# Patient Record
Sex: Male | Born: 1960 | Race: Black or African American | Hispanic: No | Marital: Married | Smoking: Former smoker
Health system: Southern US, Community
[De-identification: ages and names within clinical notes are randomized; demographics above are authoritative.]

## PROBLEM LIST (undated history)

## (undated) DIAGNOSIS — E785 Hyperlipidemia, unspecified: Secondary | ICD-10-CM

## (undated) DIAGNOSIS — E119 Type 2 diabetes mellitus without complications: Secondary | ICD-10-CM

## (undated) DIAGNOSIS — M1990 Unspecified osteoarthritis, unspecified site: Secondary | ICD-10-CM

## (undated) DIAGNOSIS — Z87442 Personal history of urinary calculi: Secondary | ICD-10-CM

## (undated) HISTORY — DX: Unspecified osteoarthritis, unspecified site: M19.90

## (undated) HISTORY — DX: Hyperlipidemia, unspecified: E78.5

## (undated) HISTORY — DX: Personal history of urinary calculi: Z87.442

## (undated) HISTORY — DX: Type 2 diabetes mellitus without complications: E11.9

---

## 2020-12-28 ENCOUNTER — Ambulatory Visit: Payer: Medicaid Other | Admitting: Orthopedic Surgery

## 2020-12-31 ENCOUNTER — Ambulatory Visit: Payer: Medicaid Other | Admitting: Orthopedic Surgery

## 2021-01-07 ENCOUNTER — Ambulatory Visit: Payer: Medicaid Other | Admitting: Orthopedic Surgery

## 2021-01-07 ENCOUNTER — Telehealth: Payer: Self-pay

## 2021-01-07 NOTE — Telephone Encounter (Signed)
Attempted to contact patient to get him rescheduled to see Dr. Ralene Ok next week.

## 2021-01-10 ENCOUNTER — Ambulatory Visit (INDEPENDENT_AMBULATORY_CARE_PROVIDER_SITE_OTHER): Payer: Medicaid Other | Admitting: Orthopedic Surgery

## 2021-01-10 ENCOUNTER — Encounter: Payer: Self-pay | Admitting: Orthopedic Surgery

## 2021-01-10 ENCOUNTER — Ambulatory Visit: Payer: Self-pay

## 2021-01-10 DIAGNOSIS — M5442 Lumbago with sciatica, left side: Secondary | ICD-10-CM

## 2021-01-10 DIAGNOSIS — M25572 Pain in left ankle and joints of left foot: Secondary | ICD-10-CM

## 2021-01-10 DIAGNOSIS — G8929 Other chronic pain: Secondary | ICD-10-CM | POA: Diagnosis not present

## 2021-01-10 DIAGNOSIS — M25571 Pain in right ankle and joints of right foot: Secondary | ICD-10-CM | POA: Diagnosis not present

## 2021-01-10 MED ORDER — CELECOXIB 200 MG PO CAPS: 200.0000 mg | ORAL_CAPSULE | Freq: Two times a day (BID) | ORAL | 3 refills | Status: DC

## 2021-01-10 NOTE — Progress Notes (Signed)
Office Visit Note   Patient: Micheal Garcia           Date of Birth: 1960-08-07           MRN: [id number] Visit Date: 01/10/2021              Requested by: Madaline Brilliant, NP [facility] at [facility] [address] [place],  [place] [address] PCP: Madaline Brilliant, NP  Chief Complaint  Patient presents with   Lower Back - Pain   Right Ankle - Pain   Left Ankle - Pain      HPI: Patient is a 60 year old gentleman who is seen for chronic pain of multiple joints in his body.  Patient states he has pain with start up in the morning.  He states he has had knee pain left wrist pain right elbow pain bilateral midfoot pain as well as back pain with left-sided radicular symptoms.  Patient states he is a type II diabetic.  He feels like all his joints are locking up.  Assessment & Plan: Visit Diagnoses:  1. Chronic pain of both ankles   2. Chronic left-sided low back pain with left-sided sciatica     Plan: Will prescribe Celebrex to help with his multiple arthritic symptoms.  With his diabetes did not recommend prednisone.  Recommended stiff soled shoes for both feet to unload the talonavicular joint.  Recommended Voltaren gel for the midfoot arthritis 3 times a day.  Follow-Up Instructions: Return in about 4 weeks (around 02/07/2021).   Ortho Exam  Patient is alert, oriented, no adenopathy, well-dressed, normal affect, normal respiratory effort. Examination patient has a negative straight leg raise bilaterally no focal motor weakness in either lower extremity.  Examination of both feet he has good subtalar and ankle range of motion.  He has pain to palpation across the talonavicular joint bilaterally radiographs shows spurring at the talonavicular joint bilaterally.  Examination of the left leg he has a negative sciatic tension sign he has pain radiating to the left knee.  Patient is a type II diabetic but there are no hemoglobin A1c's in the system.  Imaging: No results  found. No images are attached to the encounter.  Labs: No results found for: HGBA1C, ESRSEDRATE, CRP, LABURIC, REPTSTATUS, GRAMSTAIN, CULT, LABORGA   No results found for: ALBUMIN, PREALBUMIN, CBC  No results found for: MG No results found for: VD25OH  No results found for: PREALBUMIN No flowsheet data found.   There is no height or weight on file to calculate BMI.  Orders:  Orders Placed This Encounter  Procedures   XR Ankle 2 Views Right   XR Ankle 2 Views Left   XR Lumbar Spine 2-3 Views   Meds ordered this encounter  Medications   celecoxib (CELEBREX) 200 MG capsule    Sig: Take 1 capsule (200 mg total) by mouth 2 (two) times daily.    Dispense:  60 capsule    Refill:  3     Procedures: No procedures performed  Clinical Data: No additional findings.  ROS:  All other systems negative, except as noted in the HPI. Review of Systems  Objective: Vital Signs: There were no vitals taken for this visit.  Specialty Comments:  No specialty comments available.  PMFS History: There are no problems to display for this patient.  History reviewed. No pertinent past medical history.  History reviewed. No pertinent family history.  History reviewed. No pertinent surgical history. Social History   Occupational History  Not on file  Tobacco Use   Smoking status: Not on file   Smokeless tobacco: Not on file  Substance and Sexual Activity   Alcohol use: Not on file   Drug use: Not on file   Sexual activity: Not on file

## 2021-01-11 ENCOUNTER — Encounter: Payer: Self-pay | Admitting: Orthopedic Surgery

## 2021-01-11 ENCOUNTER — Ambulatory Visit: Payer: Self-pay

## 2021-01-11 ENCOUNTER — Other Ambulatory Visit: Payer: Self-pay

## 2021-01-11 ENCOUNTER — Ambulatory Visit (INDEPENDENT_AMBULATORY_CARE_PROVIDER_SITE_OTHER): Payer: Medicaid Other | Admitting: Orthopedic Surgery

## 2021-01-11 VITALS — BP 123/82 | HR 88 | Ht 70.5 in | Wt 232.0 lb

## 2021-01-11 DIAGNOSIS — M19021 Primary osteoarthritis, right elbow: Secondary | ICD-10-CM

## 2021-01-11 DIAGNOSIS — M19032 Primary osteoarthritis, left wrist: Secondary | ICD-10-CM

## 2021-01-11 DIAGNOSIS — M79641 Pain in right hand: Secondary | ICD-10-CM

## 2021-01-11 DIAGNOSIS — M25521 Pain in right elbow: Secondary | ICD-10-CM

## 2021-01-11 DIAGNOSIS — M25532 Pain in left wrist: Secondary | ICD-10-CM | POA: Diagnosis not present

## 2021-01-11 MED ORDER — BETAMETHASONE SOD PHOS & ACET 6 (3-3) MG/ML IJ SUSP
6.0000 mg | INTRAMUSCULAR | Status: AC | PRN
Start: 2021-01-11 — End: 2021-01-11
  Administered 2021-01-11: 6 mg via INTRA_ARTICULAR

## 2021-01-11 MED ORDER — LIDOCAINE HCL 1 % IJ SOLN
1.0000 mL | INTRAMUSCULAR | Status: AC | PRN
  Administered 2021-01-11: 1 mL

## 2021-01-11 NOTE — Progress Notes (Signed)
Office Visit Note   Patient: Micheal Garcia           Date of Birth: Jan 28, 1961           MRN: [id number] Visit Date: 01/11/2021              Requested by: Madaline Brilliant, NP [facility] at [facility] [address] [place],  [place] [address] PCP: Madaline Brilliant, NP   Assessment & Plan: Visit Diagnoses:  1. Pain of right hand   2. Pain in right elbow   3. Pain in left wrist     Plan: Discussed with patient that his symptoms are likely secondary to left radiocarpal and right elbow osteoarthritis.  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  He was recent seen by Dr. Lajoyce Garcia who prescribed Celebrex for multiple joint complaints and recommended topical voltaren gel.  We talked about other non surgical options for arthritis treatment including bracing and corticosteroid injections.  He wants to try a corticosteroid injection into his left wrist.  We discussed the importance of close blood sugar monitoring given his diabetes.  He checks his blood sugar multiple times per day.  He is going to start the celebrex, try the voltaren gel, and return to the office in several months if he's still having issues.   Follow-Up Instructions: No follow-ups on file.   Orders:  Orders Placed This Encounter  Procedures   XR Wrist 2 Views Left   XR Elbow 2 Views Right   No orders of the defined types were placed in this encounter.     Procedures: Hand/UE Inj for (Wrist arthritis) on 01/11/2021 4:08 PM Indications: pain Details: 25 G needle, dorsal approach Medications: 1 mL lidocaine 1 %; 6 mg betamethasone acetate-betamethasone sodium phosphate 6 (3-3) MG/ML Outcome: tolerated well, no immediate complications Procedure, treatment alternatives, risks and benefits explained, specific risks discussed. Patient was prepped and draped in the usual sterile fashion.      Clinical  Data: No additional findings.   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 Pain 9.5-10/10,     This is a 60 yo RHD M who presents with pain in the right elbow and left wrist.  These are chronic issues and have been going on for years.  His wrist pain is poorly localized but seem to mostly at the dorsal and radial aspect of the wrist.  His pain is worse w/ ROM and can be 9.5/10 at worst.  His elbow pain is similarly worse w/ ROM and activity.  He has tried tylenol and ibuprofen with some symptom relief.  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  He was recently seen by Dr. Lajoyce Garcia for bilateral midfoot, low back, and right knee pain.  He was given a prescription for celebrex but hasn't started taking it yet.    Review  of Systems  Constitutional: Negative.   Respiratory: Negative.    Cardiovascular: Negative.   Skin: Negative.     Objective: Vital Signs: BP 123/82 (BP Location: Left Arm, Patient Position: Sitting)   Pulse 88   Ht 5' 10.5" (1.791 m)   Wt 232 lb (105.2 kg)   BMI 32.82 kg/m   Physical Exam Cardiovascular:     Rate and Rhythm: Normal rate.     Pulses: Normal pulses.  Pulmonary:     Effort: Pulmonary effort is normal.  Skin:    General: Skin is warm and dry.     Capillary Refill: Capillary refill takes less than 2 seconds.  Neurological:     Mental Status: He is alert.    Left Hand Exam   Tenderness  The patient is experiencing tenderness in the dorsal area.   Muscle Strength  The  patient has normal left wrist strength.  Other  Erythema: absent Sensation: normal Pulse: present  Comments:  Swelling with TTP at dorsal radial aspect of wrist at radioscaphoid/SL interval area.  Pain worse w/ terminal extension.  No ulnar sided pain.    Right Elbow Exam   Tenderness  The patient is experiencing tenderness in the radial capitellar joint.   Muscle Strength  The patient has normal right elbow strength.  Other  Erythema: absent Sensation: normal Pulse: present  Comments:  Elbow ROM from 10-100 degrees.  Near full prosupination.      Specialty Comments:  No specialty comments available.  Imaging: 2V of the R elbow taken today are reviewed and interprted by me.  They demonstrate radiocapitellar and ulnohumeral arthritis with joint space narrowing and osteophytes.  3V of the L wrist taken today also reviewed and interpreted by me.  They demonstrate radioscaphoid arthritis with joint space narrowing and osteophytes.  He also has evidence of STT arthritis.    PMFS History: There are no problems to display for this patient.  No past medical history on file.  No family history on file.  No past surgical history on file. Social History   Occupational History   Not on file  Tobacco Use   Smoking status: Never   Smokeless tobacco: Never  Substance and Sexual Activity   Alcohol use: Not on file   Drug use: Not on file   Sexual activity: Not on file

## 2021-01-25 ENCOUNTER — Ambulatory Visit: Payer: Medicaid Other | Admitting: Orthopedic Surgery

## 2021-02-04 ENCOUNTER — Ambulatory Visit: Payer: Medicaid Other | Admitting: Orthopedic Surgery

## 2021-02-07 ENCOUNTER — Ambulatory Visit: Payer: Medicaid Other | Admitting: Orthopedic Surgery

## 2021-02-15 ENCOUNTER — Ambulatory Visit: Payer: Medicaid Other | Admitting: Orthopedic Surgery

## 2021-02-17 ENCOUNTER — Other Ambulatory Visit: Payer: Self-pay

## 2021-02-17 ENCOUNTER — Encounter: Payer: Self-pay | Admitting: Orthopedic Surgery

## 2021-02-17 ENCOUNTER — Ambulatory Visit (INDEPENDENT_AMBULATORY_CARE_PROVIDER_SITE_OTHER): Payer: Medicaid Other | Admitting: Orthopedic Surgery

## 2021-02-17 ENCOUNTER — Telehealth: Payer: Self-pay | Admitting: Orthopedic Surgery

## 2021-02-17 DIAGNOSIS — M5442 Lumbago with sciatica, left side: Secondary | ICD-10-CM | POA: Diagnosis not present

## 2021-02-17 DIAGNOSIS — G8929 Other chronic pain: Secondary | ICD-10-CM | POA: Diagnosis not present

## 2021-02-17 NOTE — Progress Notes (Signed)
   Office Visit Note   Patient: Micheal Garcia           Date of Birth: [birth year]           MRN: [id number] Visit Date: 02/17/2021              Requested by: Madaline Brilliant, NP [facility] at [facility] [address] [place],  [place] [address] PCP: Madaline Brilliant, NP  Chief Complaint  Patient presents with   Lower Back - Pain, Follow-up      HPI: Patient is a 60 year old gentleman who presents with chronic lower back pain with radicular symptoms.  Patient states that the Celebrex has not worked.  Exercise has not provided any relief.  Assessment & Plan: Visit Diagnoses:  1. Chronic left-sided low back pain with left-sided sciatica     Plan: Chronic lower back pain with chronic history of sciatic symptoms worse now on the left lower extremity.  Plan: We will order an MRI scan of the lumbar spine and plan to follow-up with Dr. Alvester Morin for evaluation for epidural steroid injection.  Follow-Up Instructions: Return in about 2 weeks (around 03/03/2021) for Plan to follow-up with Dr. Alvester Morin after the MRI scan.   Ortho Exam  Patient is alert, oriented, no adenopathy, well-dressed, normal affect, normal respiratory effort. Patient has no change in his physical exam he has a negative sciatic tension sign no focal motor weakness.  Imaging: No results found. No images are attached to the encounter.  Labs: No results found for: HGBA1C, ESRSEDRATE, CRP, LABURIC, REPTSTATUS, GRAMSTAIN, CULT, LABORGA   No results found for: ALBUMIN, PREALBUMIN, CBC  No results found for: MG No results found for: VD25OH  No results found for: PREALBUMIN No flowsheet data found.   There is no height or weight on file to calculate BMI.  Orders:  Orders Placed This Encounter  Procedures   MR Lumbar Spine w/o contrast   No orders of the defined types were placed in this encounter.    Procedures: No procedures performed  Clinical Data: No additional findings.  ROS:  All other  systems negative, except as noted in the HPI. Review of Systems  Objective: Vital Signs: There were no vitals taken for this visit.  Specialty Comments:  No specialty comments available.  PMFS History: Patient Active Problem List   Diagnosis Date Noted   Arthritis of wrist, left 01/11/2021   Degenerative arthritis of right elbow 01/11/2021   History reviewed. No pertinent past medical history.  History reviewed. No pertinent family history.  History reviewed. No pertinent surgical history. Social History   Occupational History   Not on file  Tobacco Use   Smoking status: Never   Smokeless tobacco: Never  Substance and Sexual Activity   Alcohol use: Not on file   Drug use: Not on file   Sexual activity: Not on file

## 2021-02-17 NOTE — Telephone Encounter (Signed)
Left another message for pt to call back and get resch'd per Autumn as Dr. Lajoyce Corners will be out of office this afternoon

## 2021-02-24 ENCOUNTER — Telehealth: Payer: Self-pay

## 2021-02-24 NOTE — Telephone Encounter (Signed)
I called and lm on vm to advise the pt that his MRI has been denied by his insurance company and that he must complete 6 weeks of formal physical therapy first before they will consider auth for scan. Advised pt to call and let me know where he would like to go otherwise I could send referral for [facility] neuro rehab.

## 2021-02-25 ENCOUNTER — Other Ambulatory Visit: Payer: Self-pay

## 2021-02-25 ENCOUNTER — Ambulatory Visit (INDEPENDENT_AMBULATORY_CARE_PROVIDER_SITE_OTHER): Payer: Medicaid Other | Admitting: Orthopedic Surgery

## 2021-02-25 DIAGNOSIS — M19021 Primary osteoarthritis, right elbow: Secondary | ICD-10-CM | POA: Diagnosis not present

## 2021-02-25 DIAGNOSIS — M19032 Primary osteoarthritis, left wrist: Secondary | ICD-10-CM | POA: Diagnosis not present

## 2021-02-25 MED ORDER — DICLOFENAC SODIUM 75 MG PO TBEC: 75.0000 mg | DELAYED_RELEASE_TABLET | Freq: Two times a day (BID) | ORAL | 0 refills | Status: AC

## 2021-02-25 NOTE — Progress Notes (Signed)
Office Visit Note   Patient: Micheal Garcia           Date of Birth: 11-09-1960           MRN: [id number] Visit Date: 02/25/2021              Requested by: Madaline Brilliant, NP [facility] at [facility] [address] [place],  [place] [address] PCP: Madaline Brilliant, NP   Assessment & Plan: Visit Diagnoses:  1. Arthritis of wrist, left   2. Primary osteoarthritis of right elbow     Plan: We again discussed the nature of osteoarthritis and treatment options from conservative to surgical.  He got a month or so relief from a corticosteroid injection into the left wrist.  He has been on Celebrex with modest symptom relief.  His biggest concern is being able to work.  He is not interested in surgery at this point given the lengthy time out from work that would be required.  We will try a stronger anti-inflammatory and send him to hand therapy to have a removable wrist brace made which may provide some support while he's working.  He can see me back as needed.   Follow-Up Instructions: No follow-ups on file.   Orders:  No orders of the defined types were placed in this encounter.  No orders of the defined types were placed in this encounter.     Procedures: No procedures performed   Clinical Data: No additional findings.   Subjective: Chief Complaint  Patient presents with   Right Elbow - Follow-up   Left Wrist - Follow-up    This is a 60 yo RHD M who presents with continued pain in the right elbow and left wrist.  He was last seen about a month ago at which time we discussed the nature of osteoarthritis.  He underwent CSI into the left wrist with a month or so relief.  His biggest issues occur while he is trying to work.  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  He also has chronic pain and limited ROM in the right shoulder.  He has tried celebrex previously but has recently been taken off of this medication.  He is having concurrent back issues  and will be undergoing MRI and ESI in the near future.         Review of Systems   Objective: Vital Signs: There were no vitals taken for this visit.  Physical Exam Constitutional:      Appearance: Normal appearance.  Cardiovascular:     Rate and Rhythm: Normal rate.     Pulses: Normal pulses.  Pulmonary:     Effort: Pulmonary effort is normal.  Skin:    General: Skin is warm and dry.     Capillary Refill: Capillary refill takes less than 2 seconds.  Neurological:     Mental Status: He is alert.    Left Hand Exam   Tenderness  The patient is experiencing tenderness in the dorsal area.   Muscle Strength  The patient has normal left wrist strength.  Other  Erythema: absent Sensation: normal Pulse: present  Comments:  Mild swelling of dorsal wrist w/ TTP at radioscaphoid/SL interval area.    Right Elbow Exam   Tenderness  The patient is experiencing tenderness in the lateral epicondyle and radial head.   Other  Erythema: absent Sensation: normal Pulse: present  Comments:  ROM from approx 10-90 degrees w/ most  pain at terminal extension.     Specialty Comments:  No specialty comments available.  Imaging: No results found.   PMFS History: Patient Active Problem List   Diagnosis Date Noted   Arthritis of wrist, left 01/11/2021   Degenerative arthritis of right elbow 01/11/2021   No past medical history on file.  No family history on file.  No past surgical history on file. Social History   Occupational History   Not on file  Tobacco Use   Smoking status: Never   Smokeless tobacco: Never  Substance and Sexual Activity   Alcohol use: Not on file   Drug use: Not on file   Sexual activity: Not on file

## 2021-02-28 ENCOUNTER — Telehealth: Payer: Self-pay | Admitting: Orthopedic Surgery

## 2021-02-28 NOTE — Telephone Encounter (Signed)
Order has been changed to [facility]

## 2021-02-28 NOTE — Telephone Encounter (Signed)
Patient requesting referral to another PT office that accepts his Medicaid card  [phone]

## 2021-03-13 ENCOUNTER — Other Ambulatory Visit: Payer: Medicaid Other

## 2021-03-15 ENCOUNTER — Ambulatory Visit: Payer: Medicaid Other | Admitting: Occupational Therapy

## 2021-03-24 ENCOUNTER — Ambulatory Visit: Payer: Medicaid Other | Admitting: Occupational Therapy

## 2021-04-14 ENCOUNTER — Telehealth: Payer: Self-pay | Admitting: Orthopedic Surgery

## 2021-04-14 NOTE — Telephone Encounter (Signed)
Pt called wondering if he could get a refill on diclofenac 75 mg?   CB [phone]

## 2021-04-14 NOTE — Telephone Encounter (Signed)
Please see request below. Last filled 02/25/2021

## 2021-04-15 ENCOUNTER — Telehealth: Payer: Self-pay | Admitting: Orthopedic Surgery

## 2021-04-15 ENCOUNTER — Other Ambulatory Visit (HOSPITAL_COMMUNITY): Payer: Self-pay | Admitting: Orthopedic Surgery

## 2021-04-15 NOTE — Telephone Encounter (Signed)
Pt called about an update for medication refill. Please call pt when medication is filled. Pt phone number is [phone].

## 2021-04-18 ENCOUNTER — Telehealth: Payer: Self-pay

## 2021-04-18 ENCOUNTER — Telehealth: Payer: Self-pay | Admitting: Orthopedic Surgery

## 2021-04-18 NOTE — Telephone Encounter (Signed)
Pt called back stating the name of the rx he would like refilled is diclofenac 75 mg; he would like a CB when this has been sent in to his [place].   [phone]

## 2021-04-18 NOTE — Telephone Encounter (Signed)
Pt called in stating he would like Dr. Frazier Butt to send in another refill on his medication since he is out of his 30 day supply. Patient didn't state what medication he needed a refill on before he hung up.

## 2021-04-18 NOTE — Telephone Encounter (Signed)
Err

## 2021-04-19 ENCOUNTER — Other Ambulatory Visit: Payer: Self-pay | Admitting: Orthopedic Surgery

## 2021-04-19 MED ORDER — DICLOFENAC SODIUM 75 MG PO TBEC: 75.0000 mg | DELAYED_RELEASE_TABLET | Freq: Two times a day (BID) | ORAL | 0 refills | Status: AC

## 2021-05-13 NOTE — Telephone Encounter (Deleted)
Error

## 2021-05-19 ENCOUNTER — Other Ambulatory Visit: Payer: Self-pay | Admitting: Radiology

## 2021-05-19 MED ORDER — MELOXICAM 7.5 MG PO TABS: 7.5000 mg | ORAL_TABLET | Freq: Every day | ORAL | 0 refills | Status: DC

## 2021-06-02 ENCOUNTER — Emergency Department (HOSPITAL_COMMUNITY): Payer: Medicaid Other

## 2021-06-02 ENCOUNTER — Telehealth (HOSPITAL_COMMUNITY): Payer: Self-pay | Admitting: Emergency Medicine

## 2021-06-02 ENCOUNTER — Emergency Department (HOSPITAL_COMMUNITY)
Admission: EM | Admit: 2021-06-02 | Discharge: 2021-06-02 | Disposition: A | Discharge status: Home / Self Care | Payer: Medicaid Other | Attending: Emergency Medicine | Admitting: Emergency Medicine

## 2021-06-02 ENCOUNTER — Encounter (HOSPITAL_COMMUNITY): Payer: Self-pay | Admitting: Emergency Medicine

## 2021-06-02 ENCOUNTER — Other Ambulatory Visit: Payer: Self-pay

## 2021-06-02 DIAGNOSIS — R109 Unspecified abdominal pain: Secondary | ICD-10-CM | POA: Diagnosis present

## 2021-06-02 DIAGNOSIS — N200 Calculus of kidney: Secondary | ICD-10-CM | POA: Diagnosis not present

## 2021-06-02 DIAGNOSIS — I10 Essential (primary) hypertension: Secondary | ICD-10-CM | POA: Insufficient documentation

## 2021-06-02 LAB — COMPREHENSIVE METABOLIC PANEL
ALT: 22 U/L (ref 0–44)
AST: 20 U/L (ref 15–41)
Albumin: 4.3 g/dL (ref 3.5–5.0)
Alkaline Phosphatase: 53 U/L (ref 38–126)
Anion gap: 10 (ref 5–15)
BUN: 18 mg/dL (ref 8–23)
CO2: 22 mmol/L (ref 22–32)
Calcium: 9.3 mg/dL (ref 8.9–10.3)
Chloride: 105 mmol/L (ref 98–111)
Creatinine, Ser: 1.17 mg/dL (ref 0.61–1.24)
GFR, Estimated: >60 mL/min (ref >60)
Glucose, Bld: 190 mg/dL — ABNORMAL HIGH (ref 70–99)
Potassium: 3.4 mmol/L — ABNORMAL LOW (ref 3.5–5.1)
Sodium: 137 mmol/L (ref 135–145)
Total Bilirubin: 0.8 mg/dL (ref 0.3–1.2)
Total Protein: 7.3 g/dL (ref 6.5–8.1)

## 2021-06-02 LAB — URINALYSIS, ROUTINE W REFLEX MICROSCOPIC
Bilirubin Urine: NEGATIVE
Glucose, UA: 150 mg/dL — AB
Hgb urine dipstick: NEGATIVE
Ketones, ur: 5 mg/dL — AB
Leukocytes,Ua: NEGATIVE
Nitrite: NEGATIVE
Protein, ur: NEGATIVE mg/dL
Specific Gravity, Urine: 1.026 (ref 1.005–1.030)
pH: 7 (ref 5.0–8.0)

## 2021-06-02 LAB — LIPASE, BLOOD: Lipase: 29 U/L (ref 11–51)

## 2021-06-02 LAB — CBC
HCT: 42.1 % (ref 39.0–52.0)
Hemoglobin: 13.8 g/dL (ref 13.0–17.0)
MCH: 27.1 pg (ref 26.0–34.0)
MCHC: 32.8 g/dL (ref 30.0–36.0)
MCV: 82.5 fL (ref 80.0–100.0)
Platelets: 190 10*3/uL (ref 150–400)
RBC: 5.1 MIL/uL (ref 4.22–5.81)
RDW: 14 % (ref 11.5–15.5)
WBC: 10.2 10*3/uL (ref 4.0–10.5)
nRBC: 0 % (ref 0.0–0.2)

## 2021-06-02 MED ORDER — SODIUM CHLORIDE 0.9 % IV BOLUS
500.0000 mL | Freq: Once | INTRAVENOUS | Status: AC
  Administered 2021-06-02: 500 mL via INTRAVENOUS

## 2021-06-02 MED ORDER — TAMSULOSIN HCL 0.4 MG PO CAPS: 0.4000 mg | ORAL_CAPSULE | Freq: Every day | ORAL | 0 refills | Status: DC

## 2021-06-02 MED ORDER — HYDROCODONE-ACETAMINOPHEN 5-325 MG PO TABS: 1.0000 | ORAL_TABLET | Freq: Four times a day (QID) | ORAL | 0 refills | Status: DC | PRN

## 2021-06-02 MED ORDER — ONDANSETRON HCL 4 MG PO TABS: 4.0000 mg | ORAL_TABLET | Freq: Three times a day (TID) | ORAL | 0 refills | Status: DC | PRN

## 2021-06-02 MED ORDER — KETOROLAC TROMETHAMINE 30 MG/ML IJ SOLN
30.0000 mg | Freq: Once | INTRAMUSCULAR | Status: AC
  Administered 2021-06-02: 30 mg via INTRAVENOUS
  Filled 2021-06-02: qty 1

## 2021-06-02 MED ORDER — MORPHINE SULFATE (PF) 4 MG/ML IV SOLN
4.0000 mg | Freq: Once | INTRAVENOUS | Status: AC
  Administered 2021-06-02: 4 mg via INTRAVENOUS
  Filled 2021-06-02: qty 1

## 2021-06-02 NOTE — Telephone Encounter (Signed)
Pharmacy called and there was a problem with the Norco prescription that I sent.  They requested the prescription get recent which I have completed.

## 2021-06-02 NOTE — Discharge Instructions (Addendum)
You have been seen and discharged from the emergency department.  You have been diagnosed with a right-sided kidney stone.  Stay well-hydrated.  Take ibuprofen as needed for pain control.  Take stronger pain medicine as needed.  Do not mix this medication with alcohol or other sedating medications. Do not drive or do heavy physical activity until you know how this medication affects you.  It may cause drowsiness.  Take Flomax to aid in stone passage.  Take nausea medicine as needed.  Follow-up with your primary provider for further evaluation and further care. Take home medications as prescribed. If you have any worsening symptoms, worsening pain, inability to tolerate oral food/water/medications, high fevers or further concerns for your health please return to an emergency department for further evaluation.

## 2021-06-02 NOTE — ED Provider Notes (Signed)
[facility] DEPT Provider Note   CSN: [id number] Arrival date & time: 06/02/21  [id number]     History  Chief Complaint  Patient presents with   Abdominal Pain    Micheal Garcia is a 61 y.o. male.  HPI  61 year old male with past medical history of HTN, HLD presents to the emergency department with right-sided abdominal pain that radiates to the right flank.  Patient states this woke him up from sleep at around 3 AM.  He describes the pain as right abdominal radiating to the right flank and back.  He has never had any pain like this before.  Denies any history kidney stones.  No genitourinary symptoms.  Denies any fever, nausea/vomiting/diarrhea.  No history of AAA.  States since the pain started and has been constant, sharp and worsening in severity.  Home Medications Prior to Admission medications   Medication Sig Start Date End Date Taking? Authorizing Provider  atorvastatin (LIPITOR) 40 MG tablet Take 40 mg by mouth daily. 04/14/21   [provider]  losartan (COZAAR) 50 MG tablet Take 50 mg by mouth daily. 03/21/21   [provider]  meloxicam (MOBIC) 7.5 MG tablet Take 1 tablet (7.5 mg total) by mouth daily. 05/19/21   Sherilyn Cooter, MD  metFORMIN (GLUCOPHAGE) 1000 MG tablet SMARTSIG:1 Tablet(s) By Mouth Morning-Evening 03/25/21   [provider]      Allergies    Patient has no allergy information on record.    Review of Systems   Review of Systems  Constitutional:  Negative for fever.  Respiratory:  Negative for shortness of breath.   Cardiovascular:  Negative for chest pain.  Gastrointestinal:  Positive for abdominal pain. Negative for diarrhea and vomiting.  Genitourinary:  Positive for flank pain. Negative for difficulty urinating, dysuria and hematuria.  Musculoskeletal:  Positive for back pain.  Skin:  Negative for rash.  Neurological:  Negative for headaches.   Physical Exam Updated Vital Signs BP (!)  146/88    Pulse 81    Temp 98.3 F (36.8 C) (Oral)    Resp 19    SpO2 98%  Physical Exam Vitals and nursing note reviewed.  Constitutional:      General: He is not in acute distress.    Appearance: Normal appearance.  HENT:     Head: Normocephalic.     Mouth/Throat:     Mouth: Mucous membranes are moist.  Cardiovascular:     Rate and Rhythm: Normal rate.  Pulmonary:     Effort: Pulmonary effort is normal. No respiratory distress.  Abdominal:     Palpations: Abdomen is soft.     Tenderness: There is abdominal tenderness in the right lower quadrant. There is no guarding or rebound.  Skin:    General: Skin is warm.  Neurological:     Mental Status: He is alert and oriented to person, place, and time. Mental status is at baseline.  Psychiatric:        Mood and Affect: Mood normal.    ED Results / Procedures / Treatments   Labs (all labs ordered are listed, but only abnormal results are displayed) Labs Reviewed  COMPREHENSIVE METABOLIC PANEL - Abnormal; Notable for the following components:      Result Value   Potassium 3.4 (*)    Glucose, Bld 190 (*)    All other components within normal limits  LIPASE, BLOOD  CBC  URINALYSIS, ROUTINE W REFLEX MICROSCOPIC    EKG None  Radiology No  results found.  Procedures Procedures    Medications Ordered in ED Medications  morphine (PF) 4 MG/ML injection 4 mg (4 mg Intravenous Given 06/02/21 0916)  sodium chloride 0.9 % bolus 500 mL (500 mLs Intravenous New Bag/Given 06/02/21 [id number])    ED Course/ Medical Decision Making/ A&P                           Medical Decision Making Amount and/or Complexity of Data Reviewed Labs: ordered. Radiology: ordered. ECG/medicine tests: ordered.  Risk Prescription drug management.   This patient presents to the ED for concern of right-sided abdominal/flank/back pain, this involves an extensive number of treatment options, and is a complaint that carries with it a high risk of  complications and morbidity.  The differential diagnosis includes kidney stone, intra-abdominal pathology, AAA   Additional history obtained: -Additional history obtained from spouse at bedside -External records from outside source obtained and reviewed including: Chart review including previous notes, labs, imaging, consultation notes   Lab Tests: -I ordered, reviewed, and interpreted labs.  The pertinent results include: Baseline and reassuring blood work   Imaging Studies ordered: -I ordered imaging studies including ultrasound of the aorta and CT renal study -I independently visualized and interpreted imaging which showed no aneurysm and right-sided 2 mm kidney stone at the UVJ -I agree with the radiologist interpretation   Medicines ordered and prescription drug management: -I ordered medication including pain medicine for kidney stone -Reevaluation of the patient after these medicines showed that the patient improved -I have reviewed the patients home medicines and have made adjustments as needed   ED Course: 61 year old male presents emergency department right-sided abdominal pain radiating to his flank/back.  Vitals are stable, abdomen is benign.  Ultrasound shows no AAA, blood work is baseline and reassuring.  CT renal study shows a 2 mm kidney stone on the right with some mild perinephric stranding.  No signs of obstruction, AKI or UTI.  Plan for treatment and outpatient follow-up.   Cardiac Monitoring: The patient was maintained on a cardiac monitor.  I personally viewed and interpreted the cardiac monitored which showed an underlying rhythm of: Sinus   Reevaluation: After the interventions noted above, I reevaluated the patient and found that they have :improved   Dispostion: Patient at this time appears safe and stable for discharge and close outpatient follow up. Discharge plan and strict return to ED precautions discussed, patient verbalizes understanding and  agreement.        Final Clinical Impression(s) / ED Diagnoses Final diagnoses:  None    Rx / DC Orders ED Discharge Orders     None         Lorelle Gibbs, DO 06/02/21 1153

## 2021-06-02 NOTE — ED Triage Notes (Signed)
Pt reports abdominal pain that radiates from middle to the right side around to his back. Pain since 3am.

## 2021-06-02 NOTE — Telephone Encounter (Signed)
Needed resend the script

## 2021-06-02 NOTE — ED Notes (Signed)
Pt states understanding of dc instructions, importance of follow up. Pt denies questions or concerns and declined transportation assistance upon dc. Pt ambulated w/ a steady gait w/o need for assistance. No belongings left in room upon dc. ? ?

## 2021-08-15 ENCOUNTER — Ambulatory Visit (INDEPENDENT_AMBULATORY_CARE_PROVIDER_SITE_OTHER): Payer: Medicaid Other | Admitting: Orthopedic Surgery

## 2021-08-15 DIAGNOSIS — M19032 Primary osteoarthritis, left wrist: Secondary | ICD-10-CM

## 2021-08-15 MED ORDER — LIDOCAINE HCL 1 % IJ SOLN
1.0000 mL | INTRAMUSCULAR | Status: AC | PRN
  Administered 2021-08-15: 1 mL

## 2021-08-15 MED ORDER — BETAMETHASONE SOD PHOS & ACET 6 (3-3) MG/ML IJ SUSP
6.0000 mg | INTRAMUSCULAR | Status: AC | PRN
  Administered 2021-08-15: 6 mg via INTRA_ARTICULAR

## 2021-08-15 NOTE — Progress Notes (Signed)
? ?Office Visit Note ?  ?Patient: Micheal Garcia           ?Date of Birth: [birth year]           ?MRN: [id number] ?Visit Date: 08/15/2021 ?             ?Requested by: No referring provider defined for this encounter. ?PCP: Madaline Brilliant, NP (Inactive) ? ? ?Assessment & Plan: ?Visit Diagnoses:  ?1. Arthritis of wrist, left   ? ? ?Plan: We again reviewed the nature of wrist arthritis including its diagnosis, prognosis, and both conservative and surgical treatment options.  He had 1 corticosteroid junction with give him significant relief for at least a month.  He is wearing a removable wrist brace.  We discussed surgical treatment options for wrist arthritis.  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  After our discussion today, he would like to proceed with a repeat corticosteroid junction in the left wrist. ? ?Follow-Up Instructions: No follow-ups on file.  ? ?Orders:  ?No orders of the defined types were placed in this encounter. ? ?No orders of the defined types were placed in this encounter. ? ? ? ? Procedures: ?Medium Joint Inj: L radiocarpal on 08/15/2021 9:59 AM ?Indications: joint swelling and pain ?Details: 25 G 1.5 in needle, dorsal approach ?Medications: 1 mL lidocaine 1 %; 6 mg betamethasone acetate-betamethasone sodium phosphate 6 (3-3) MG/ML ?Procedure, treatment alternatives, risks and benefits explained, specific risks discussed. Consent was given by the patient. Immediately prior to procedure a time out was called to verify the correct patient, procedure, equipment, support staff and site/side marked as required. Patient was prepped and draped in the usual sterile fashion.  ? ? ? ? ?Clinical Data: ?No additional findings. ? ? ?Subjective: ?Chief Complaint  ?Patient presents with  ? Left Wrist - Pain  ? Right Elbow - Pain  ? ? 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  He would like to talk about his wrist pain today as it seems to be most bothersome for him.  He continues to describe pain at the dorsal radial aspect of the wrist that is worse with prolonged activity and range of motion.  He has tried an prescription oral anti-inflammatory but gets better some relief from over-the-counter arthritis medication.  He had a corticosteroid injection previously which provided significant symptom relief for a month or so.  He has been wearing a wrist brace.  He also has significant problems with his back which he has been for previously. ? ? ? ?Review of Systems ? ? ?Objective: ?Vital Signs: There were no vitals taken for this visit. ? ?Physical Exam ?Constitutional:   ?   Appearance: Normal appearance.  ?Cardiovascular:  ?   Rate and Rhythm: Normal rate.  ?   Pulses: Normal pulses.  ?Pulmonary:  ?   Effort: Pulmonary effort is normal.  ?Skin: ?   General: Skin is warm and dry.  ?   Capillary Refill: Capillary refill takes less than 2 seconds.  ?Neurological:  ?   Mental Status: He is alert.  ? ? ?Left Hand Exam  ? ?Tenderness  ?Left hand tenderness location: TTP at dorsal and radial aspect of wrist w/ mild to moderate swelling.  ? ?Other  ?Erythema: absent ?Sensation: normal ?Pulse:  present ? ?Comments:  Wrist ROM 55/20 and significantly diminished from contralateral side.  Pain w/ terminal wrist extension/flexion/radial deviation.  Full and painless ROM of fingers and able to make a complete fist.  ? ? ? ? ?Specialty Comments:  ?No specialty comments available. ? ?Imaging: ?No results found. ? ? ?PMFS History: ?Patient Active Problem List  ? Diagnosis Date Noted  ? Arthritis of wrist, left 01/11/2021  ? Degenerative arthritis of right elbow 01/11/2021  ? ?No past medical history on file.  ?No family history on file.  ?No past surgical history on file. ?Social History  ? ?Occupational History  ?  Not on file  ?Tobacco Use  ? Smoking status: Never  ? Smokeless tobacco: Never  ?Substance and Sexual Activity  ? Alcohol use: Not on file  ? Drug use: Not on file  ? Sexual activity: Not on file  ? ? ? ? ? ? ?

## 2021-09-15 ENCOUNTER — Ambulatory Visit: Payer: Medicaid Other | Admitting: Orthopedic Surgery

## 2021-09-26 ENCOUNTER — Ambulatory Visit (INDEPENDENT_AMBULATORY_CARE_PROVIDER_SITE_OTHER): Payer: Medicaid Other

## 2021-09-26 ENCOUNTER — Ambulatory Visit (INDEPENDENT_AMBULATORY_CARE_PROVIDER_SITE_OTHER): Payer: Medicaid Other | Admitting: Orthopedic Surgery

## 2021-09-26 ENCOUNTER — Encounter: Payer: Self-pay | Admitting: Orthopedic Surgery

## 2021-09-26 DIAGNOSIS — G8929 Other chronic pain: Secondary | ICD-10-CM | POA: Diagnosis not present

## 2021-09-26 DIAGNOSIS — M5441 Lumbago with sciatica, right side: Secondary | ICD-10-CM

## 2021-09-26 MED ORDER — PREDNISONE 10 MG PO TABS: 10.0000 mg | ORAL_TABLET | Freq: Every day | ORAL | 0 refills | Status: DC

## 2021-09-26 NOTE — Progress Notes (Signed)
   Office Visit Note   Patient: Micheal Garcia           Date of Birth: 03/26/1961           MRN: [id number] Visit Date: 09/26/2021              Requested by: No referring provider defined for this encounter. 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  His lower back pain is gotten worse over the past several weeks and has been symptomatic for over a year.  Assessment & Plan: Visit Diagnoses:  1. Chronic right-sided low back pain with right-sided sciatica     Plan: We will try low-dose prednisone 10 mg with breakfast discussed this will increase his blood glucose.  Follow-Up Instructions: Return in about 4 weeks (around 10/24/2021).   Ortho Exam  Patient is alert, oriented, no adenopathy, well-dressed, normal affect, normal respiratory effort. Examination patient has a positive straight leg raise on the right there is no focal motor weakness with good strength in all motor groups.  Imaging: XR Lumbar Spine 2-3 Views  Result Date: 09/26/2021 2 view radiographs of the lumbar spine shows degenerative disc disease worse at L3-L4 and L5.  No pars defect.  No images are attached to the encounter.  Labs: No results found for: "HGBA1C", "ESRSEDRATE", "CRP", "LABURIC", "REPTSTATUS", "GRAMSTAIN", "CULT", "LABORGA"   Lab Results  Component Value Date   ALBUMIN 4.3 06/02/2021    No results found for: "MG" No results found for: "VD25OH"  No results found for: "PREALBUMIN"    Latest Ref Rng & Units 06/02/2021    7:51 AM  CBC EXTENDED  WBC 4.0 - 10.5 K/uL 10.2   RBC 4.22 - 5.81 MIL/uL 5.10   Hemoglobin 13.0 - 17.0 g/dL 33.8   HCT 25.0 - 53.9 % 42.1   Platelets 150 - 400 K/uL 190      There is no height or weight on file to calculate  BMI.  Orders:  Orders Placed This Encounter  Procedures   XR Lumbar Spine 2-3 Views   Meds ordered this encounter  Medications   predniSONE (DELTASONE) 10 MG tablet    Sig: Take 1 tablet (10 mg total) by mouth daily with breakfast.    Dispense:  30 tablet    Refill:  0     Procedures: No procedures performed  Clinical Data: No additional findings.  ROS:  All other systems negative, except as noted in the HPI. Review of Systems  Objective: Vital Signs: There were no vitals taken for this visit.  Specialty Comments:  No specialty comments available.  PMFS History: Patient Active Problem List   Diagnosis Date Noted   Arthritis of wrist, left 01/11/2021   Degenerative arthritis of right elbow 01/11/2021   History reviewed. No pertinent past medical history.  History reviewed. No pertinent family history.  History reviewed. No pertinent surgical history. Social History   Occupational History   Not on file  Tobacco Use   Smoking status: Never   Smokeless tobacco: Never  Substance and Sexual Activity   Alcohol use: Not on file   Drug use: Not on file   Sexual activity: Not on file

## 2021-09-27 ENCOUNTER — Encounter: Payer: Self-pay | Admitting: Physician Assistant

## 2021-09-28 ENCOUNTER — Telehealth: Payer: Self-pay | Admitting: Family

## 2021-09-28 NOTE — Telephone Encounter (Signed)
Pt called and stated he can not take the prednisone that was prescribed for him. He states it made his sugar rise and was unable to see last night. Please call pt at [phone].

## 2021-09-28 NOTE — Telephone Encounter (Signed)
Lmtcb, advised on VM to discontinue taking the prednisone at this time. Asked him to call back with how he is feeling.

## 2021-10-27 ENCOUNTER — Ambulatory Visit: Payer: Medicaid Other | Admitting: Orthopedic Surgery

## 2021-10-31 ENCOUNTER — Ambulatory Visit: Payer: Medicaid Other | Admitting: Orthopedic Surgery

## 2021-11-04 ENCOUNTER — Ambulatory Visit (INDEPENDENT_AMBULATORY_CARE_PROVIDER_SITE_OTHER): Payer: Medicaid Other | Admitting: Orthopedic Surgery

## 2021-11-04 ENCOUNTER — Ambulatory Visit: Payer: Self-pay

## 2021-11-04 DIAGNOSIS — M19032 Primary osteoarthritis, left wrist: Secondary | ICD-10-CM | POA: Diagnosis not present

## 2021-11-04 MED ORDER — LIDOCAINE HCL 1 % IJ SOLN
1.0000 mL | INTRAMUSCULAR | Status: AC | PRN
  Administered 2021-11-04: 1 mL

## 2021-11-04 MED ORDER — BETAMETHASONE SOD PHOS & ACET 6 (3-3) MG/ML IJ SUSP
6.0000 mg | INTRAMUSCULAR | Status: AC | PRN
  Administered 2021-11-04: 6 mg via INTRA_ARTICULAR

## 2021-11-04 NOTE — Progress Notes (Signed)
Office Visit Note   Patient: Micheal Garcia           Date of Birth: 1961/03/16           MRN: [id number] Visit Date: 11/04/2021              Requested by: No referring provider defined for this encounter. PCP: Madaline Brilliant, NP (Inactive)   Assessment & Plan: Visit Diagnoses:  1. Arthritis of wrist, left     Plan: We reviewed patient's x-rays from today which show arthritis at the radial scaphoid articulation consistent with SLAC wrist.  We again discussed the nature of wrist arthritis.  At this point, we will try another corticosteroid injection and I will refer him to hand therapy to have a better fitting Thermoplast brace made and work on grip strength and adaptive strategies to help with his wrist pain.  Follow-Up Instructions: No follow-ups on file.   Orders:  Orders Placed This Encounter  Procedures   XR Wrist Complete Left   No orders of the defined types were placed in this encounter.     Procedures: Medium Joint Inj: L radiocarpal on 11/04/2021 4:58 PM Indications: pain and joint swelling Details: 25 G 1.5 in needle, dorsal approach Medications: 1 mL lidocaine 1 %; 6 mg betamethasone acetate-betamethasone sodium phosphate 6 (3-3) MG/ML Procedure, treatment alternatives, risks and benefits explained, specific risks discussed. Consent was given by the patient. Immediately prior to procedure a time out was called to verify the correct patient, procedure, equipment, support staff and site/side marked as required. Patient was prepped and draped in the usual sterile fashion.       Clinical Data: No additional findings.   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  He was last seen in my office in the beginning of May at which point he underwent  corticosteroid injection into the wrist.  This provides some symptom relief.  He wears a brace but does not fit very well for him.  He is having difficulty with his job secondary to this wrist pain.  He also has significant problems with his back and is being followed for chronic back pain.    Review of Systems   Objective: Vital Signs: There were no vitals taken for this visit.  Physical Exam  Left Hand Exam   Tenderness  Left hand tenderness location: TTP at radiocarpal articulation with mild swelling.   Other  Erythema: absent Sensation: normal Pulse: present  Comments:  Wrist ROM largely unchanged at 50/20.  Full ROM of fingers but can't make a full fist today.       Specialty Comments:  No specialty comments available.  Imaging: No results found.   PMFS History: Patient Active Problem List   Diagnosis Date Noted   Arthritis of wrist, left 01/11/2021   Degenerative arthritis of right elbow 01/11/2021   No past medical history on file.  No family history on file.  No past surgical history on file. Social History   Occupational History   Not on file  Tobacco Use   Smoking status: Never   Smokeless tobacco: Never  Substance and Sexual Activity   Alcohol use: Not on file   Drug use: Not on file   Sexual activity: Not on file

## 2021-11-10 ENCOUNTER — Ambulatory Visit (INDEPENDENT_AMBULATORY_CARE_PROVIDER_SITE_OTHER): Payer: Medicaid Other | Admitting: Orthopedic Surgery

## 2021-11-10 ENCOUNTER — Encounter: Payer: Self-pay | Admitting: Orthopedic Surgery

## 2021-11-10 DIAGNOSIS — M5441 Lumbago with sciatica, right side: Secondary | ICD-10-CM

## 2021-11-10 DIAGNOSIS — G8929 Other chronic pain: Secondary | ICD-10-CM

## 2021-11-10 NOTE — Progress Notes (Signed)
   Office Visit Note   Patient: Micheal Garcia           Date of Birth: 1960-06-25           MRN: [id number] Visit Date: 11/10/2021              Requested by: No referring provider defined for this encounter. PCP: Madaline Brilliant, NP (Inactive)  Chief Complaint  Patient presents with   Lower Back - Pain, Follow-up      HPI: Patient is a 61 year old gentleman who presents in follow-up for his lumbar spine.  Patient has done therapy on his own without relief.  He has tried prednisone without relief.  Patient most recently had a injection for the left wrist and this did provide some interval relief.  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  Patient states the right-sided radicular pain is the worse with driving.  Assessment & Plan: Visit Diagnoses:  1. Chronic right-sided low back pain with right-sided sciatica     Plan: We will place an order for MRI scan of the lumbar spine.  Plan to follow-up after this is obtained anticipate follow-up with Dr. Alvester Morin.  Follow-Up Instructions: Return in about 2 weeks (around 11/24/2021).   Ortho Exam  Patient is alert, oriented, no adenopathy, well-dressed, normal affect, normal respiratory effort. Examination patient has a positive sciatic tension sign bilaterally motor strength is symmetric in both lower extremities with no focal motor weakness.  Imaging: No results found. No images are attached to the encounter.  Labs: No results found for: "HGBA1C", "ESRSEDRATE", "CRP", "LABURIC", "REPTSTATUS", "GRAMSTAIN", "CULT", "LABORGA"   Lab Results  Component Value Date   ALBUMIN 4.3 06/02/2021    No results found for: "MG" No results found for: "VD25OH"  No results found for: "PREALBUMIN"    Latest Ref Rng & Units 06/02/2021    7:51 AM  CBC EXTENDED  WBC 4.0 - 10.5 K/uL 10.2   RBC 4.22 - 5.81 MIL/uL 5.10   Hemoglobin 13.0 - 17.0 g/dL 40.8   HCT 14.4 - 81.8 % 42.1   Platelets 150  - 400 K/uL 190      There is no height or weight on file to calculate BMI.  Orders:  No orders of the defined types were placed in this encounter.  No orders of the defined types were placed in this encounter.    Procedures: No procedures performed  Clinical Data: No additional findings.  ROS:  All other systems negative, except as noted in the HPI. Review of Systems  Objective: Vital Signs: There were no vitals taken for this visit.  Specialty Comments:  No specialty comments available.  PMFS History: Patient Active Problem List   Diagnosis Date Noted   Arthritis of wrist, left 01/11/2021   Degenerative arthritis of right elbow 01/11/2021   History reviewed. No pertinent past medical history.  History reviewed. No pertinent family history.  History reviewed. No pertinent surgical history. Social History   Occupational History   Not on file  Tobacco Use   Smoking status: Never   Smokeless tobacco: Never  Substance and Sexual Activity   Alcohol use: Not on file   Drug use: Not on file   Sexual activity: Not on file

## 2021-11-18 ENCOUNTER — Encounter: Payer: Self-pay | Admitting: Occupational Therapy

## 2021-11-18 ENCOUNTER — Ambulatory Visit: Payer: Medicaid Other | Attending: Orthopedic Surgery | Admitting: Occupational Therapy

## 2021-11-18 ENCOUNTER — Telehealth: Payer: Self-pay | Admitting: Orthopedic Surgery

## 2021-11-18 DIAGNOSIS — M25532 Pain in left wrist: Secondary | ICD-10-CM | POA: Insufficient documentation

## 2021-11-18 DIAGNOSIS — R600 Localized edema: Secondary | ICD-10-CM | POA: Diagnosis present

## 2021-11-18 DIAGNOSIS — M25632 Stiffness of left wrist, not elsewhere classified: Secondary | ICD-10-CM | POA: Diagnosis present

## 2021-11-18 DIAGNOSIS — M6281 Muscle weakness (generalized): Secondary | ICD-10-CM | POA: Diagnosis present

## 2021-11-18 NOTE — Therapy (Signed)
OUTPATIENT OCCUPATIONAL THERAPY ORTHO EVALUATION  Patient Name: Micheal Garcia MRN: [id number] DOB:1960-07-05, 61 y.o., male Today's Date: 11/18/2021  PCP: Verlon Au, MD   REFERRING PROVIDER: Marlyne Beards, MD   OT End of Session - 11/18/21 [id number]     Visit Number 1    Date for OT Re-Evaluation 01/17/22    Authorization Type [place] [employer]    Authorization Time Period VL: 27 combined OT/PT/ST    OT Start Time 0840    OT Stop Time 0915    OT Time Calculation (min) 35 min    Activity Tolerance Patient limited by pain    Behavior During Therapy [facility] for tasks assessed/performed            History reviewed. No pertinent past medical history. History reviewed. No pertinent surgical history.  Patient Active Problem List   Diagnosis Date Noted   Arthritis of wrist, left 01/11/2021   Degenerative arthritis of right elbow 01/11/2021    ONSET DATE: 11/04/2021 (date of OT order)  REFERRING DIAG: M19.032 (ICD-10-CM) - Arthritis of wrist, left  THERAPY DIAG:  Pain in left wrist  Stiffness of left wrist, not elsewhere classified  Muscle weakness (generalized)  Localized edema  Rationale for Evaluation and Treatment Rehabilitation  SUBJECTIVE:   SUBJECTIVE STATEMENT: Pt arrives to OP OT evaluation w/ primary concern related to pain in his L wrist. States he started noticing some soreness in 2020, but saw the doctor more recently when the pain became constant and he could not management "no matter what he took." Pt reports he has has pain in his R elbow, stating it is "bone on bone," which has caused him to use his LUE more and likely led to more pain more quickly. OT recommended reaching out to his MD for bracing options due to referral for OT only addressing L wrist pain. Is looking into getting PT for LBP w/ sciatica. Pt accompanied by: self  PERTINENT HISTORY: Arthritis at the radial scaphoid articulation of L wrist, consistent with SLAC wrist; Per  progress note w/ Dr. Frazier Butt: "At this point, we will try another corticosteroid injection and I will refer him to hand therapy to have a better fitting Thermoplast brace made and work on grip strength and adaptive strategies to help with his wrist pain."  PRECAUTIONS: None  WEIGHT BEARING RESTRICTIONS No  PAIN: Are you having pain? Yes: NPRS scale: 8/10 Pain location: lower back Pain description: "pain" Aggravating factors: sitting/driving Relieving factors: switching between sitting and standing  FALLS: Has patient fallen in last 6 months? 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[employer]); non-emergency medical transport vehicle driver  PATIENT GOALS: Decrease pain and improve functional use of UEs  OBJECTIVE:   HAND DOMINANCE: Right  ADLs: Overall ADLs: Pt reports he is able to complete all BADLs w/ at least Mod Ind, but is relying on his L, non-dominant side due to the pain in his R elbow and is not having more pain on his L side because of it  FUNCTIONAL OUTCOME MEASURES: Quick Dash: 68.2/100  (severe functional impairment)  UPPER EXTREMITY ROM     Active ROM Right Eval - 8/11 Left Eval - 8/11  Wrist flexion 32 20  Wrist extension 57 30  Wrist ulnar deviation 32 21  Wrist radial deviation 20 12  Wrist pronation 67 56  Wrist supination 49 55  (Blank rows = not tested)  UPPER EXTREMITY MMT: Not assessed at time of evaluation due to pain  HAND FUNCTION: Grip strength: Right: 94 lbs; Left: 23 lbs  COORDINATION: WFL; increased pain on radial side of wrist w/ thumb to index finger opposition  SENSATION: Reports occasional tingling; improved w/ compression  EDEMA:  Measured circumferentially around wrist: Right: 19.4 cm, Left: 20.2 cm  COGNITION: Overall  cognitive status: Within functional limits for tasks assessed  OBSERVATIONS: Increased pain w/ decreased arc of motion on L wrist compared to R, though signs of discomfort (grimacing, pulling away) w/ wrist movement on R side due to elbow pain   TODAY'S TREATMENT:  None   PATIENT EDUCATION: Educated on role and purpose of OT as well as potential interventions and goals for therapy based on initial evaluation findings. Person educated: Patient Education method: Explanation Education comprehension: verbalized understanding   HOME EXERCISE PROGRAM: To be administered  GOALS: Goals reviewed with patient? No  SHORT TERM GOALS: Target date:  12/16/21       Status:  1 Pt will demonstrate independence w/ wear and care of custom-fabricated orthosis Baseline: to be administered next session Initial  2 Pt will demonstrate decreased edema circumferentially around L wrist to 19.9 cm or less for improved pain and ROM Baseline: Right: 19.4 cm, Left: 20.2 cm Initial  3 Pt will increase functional use of L, dominant UE during all ADLs as evidenced by improving QuickDASH score to 60% or better, indicating moderate functional impairment Baseline: 68.2/100 Initial    LONG TERM GOALS: Target date:  01/17/22       Status:  1 Pt will improve AROM of L wrist extension to at least 40 degrees to restore functional motion for tasks like reach and grasp  Baseline: Right: 57 deg, Left: 30 deg Initial  2 Pt will demonstrate independence w/ HEP designed for increased ROM and strength of L wrist and hand Baseline: to be administered Initial  3 Pt will improve grip strength in L hand by 25 lbs with pain less than 3/10 for increased functional use during IADLs and work-related tasks Baseline: Right: 94 lbs; Left: 23 lbs Initial  4 Pt will increase functional use of L, dominant UE during all ADLs as evidenced by improving QuickDASH score to 40% or better, indicating minimal functional impairment Baseline:  68.2/100 Initial    ASSESSMENT:  CLINICAL IMPRESSION: Pt is a 61 y/o who presents to OP OT due to persistent L wrist pain 2/2 arthritis consistent w/ SLAC wrist. Pt was referred by Dr. Tempie Donning for fabrication of custom fabricated wrist immobilization orthosis, indicated to prevent movement, and protect and facilitate healing for several weeks; to be administered next session. Pt is being treated conservatively and has been in a prefabricated orthosis. Evaluation indicated significant pain, decreased L hand grip strength, and limited ROM w/ testing MMT limited by protocol and pain. Pt will benefit from skilled occupational therapy services to address strength, ROM, pain management, introduction of compensatory strategies/AE prn, and implementation of an HEP to improve participation and safety during ADLs and ensure maximal functional use of dominant LUE.  PERFORMANCE DEFICITS in functional skills including edema, ROM, strength, pain, body mechanics, decreased knowledge of precautions, and UE functional use.  IMPAIRMENTS are limiting patient from ADLs, IADLs, rest and sleep, and work.   COMORBIDITIES has co-morbidities such as R elbow pain and LBP  that affects occupational performance. Patient will benefit from skilled OT to address above impairments  and improve overall function.  MODIFICATION OR ASSISTANCE TO COMPLETE EVALUATION: No modification of tasks or assist necessary to complete an evaluation.  OT OCCUPATIONAL PROFILE AND HISTORY: Problem focused assessment: Including review of records relating to presenting problem.  CLINICAL DECISION MAKING: Moderate - several treatment options, min-mod task modification necessary  REHAB POTENTIAL: Good  EVALUATION COMPLEXITY: Moderate   PLAN: OT FREQUENCY: 1x/week  OT DURATION: 8 weeks  PLANNED INTERVENTIONS: self care/ADL training, therapeutic exercise, therapeutic activity, manual therapy, passive range of motion, splinting, electrical  stimulation, ultrasound, iontophoresis, paraffin, fluidotherapy, compression bandaging, moist heat, cryotherapy, patient/family education, and DME and/or AE instructions  RECOMMENDED OTHER SERVICES: None  CONSULTED AND AGREED WITH PLAN OF CARE: Patient  PLAN FOR NEXT SESSION: Custom wrist immobilization orthosis   Rosie Fate, MSOT, OTR/L 11/18/2021, 9:12 AM

## 2021-11-18 NOTE — Telephone Encounter (Signed)
Patient called advised he went to out patient PT this morning and the therapist asked if he could get a Rx for a brace for his elbow. The number to contact patient is [phone]

## 2021-11-22 ENCOUNTER — Ambulatory Visit: Payer: Medicaid Other | Admitting: Occupational Therapy

## 2021-11-24 ENCOUNTER — Telehealth: Payer: Self-pay | Admitting: Orthopedic Surgery

## 2021-11-24 ENCOUNTER — Ambulatory Visit: Payer: Medicaid Other | Admitting: Occupational Therapy

## 2021-11-24 ENCOUNTER — Encounter: Payer: Self-pay | Admitting: Occupational Therapy

## 2021-11-24 DIAGNOSIS — M25532 Pain in left wrist: Secondary | ICD-10-CM

## 2021-11-24 DIAGNOSIS — M6281 Muscle weakness (generalized): Secondary | ICD-10-CM

## 2021-11-24 DIAGNOSIS — M25632 Stiffness of left wrist, not elsewhere classified: Secondary | ICD-10-CM

## 2021-11-24 DIAGNOSIS — R600 Localized edema: Secondary | ICD-10-CM

## 2021-11-24 NOTE — Therapy (Signed)
OUTPATIENT OCCUPATIONAL THERAPY TREATMENT NOTE  Patient Name: Micheal Garcia MRN: [id number] DOB:November 18, 1960, 61 y.o., male Today's Date: 11/24/2021  PCP: Verlon Au, MD   REFERRING PROVIDER: Marlyne Beards, MD   OT End of Session - 11/24/21 1604     Visit Number 2    Date for OT Re-Evaluation 01/17/22    Authorization Type [place] [employer]    Authorization Time Period VL: 27 combined OT/PT/ST    OT Start Time 1600    OT Stop Time 1700    OT Time Calculation (min) 60 min    Activity Tolerance Patient limited by pain    Behavior During Therapy [facility] for tasks assessed/performed            History reviewed. No pertinent past medical history. History reviewed. No pertinent surgical history.  Patient Active Problem List   Diagnosis Date Noted   Arthritis of wrist, left 01/11/2021   Degenerative arthritis of right elbow 01/11/2021    ONSET DATE: 11/04/2021 (date of OT order)  REFERRING DIAG: M19.032 (ICD-10-CM) - Arthritis of wrist, left  THERAPY DIAG:  Pain in left wrist  Stiffness of left wrist, not elsewhere classified  Muscle weakness (generalized)  Localized edema  Rationale for Evaluation and Treatment Rehabilitation  SUBJECTIVE:   SUBJECTIVE STATEMENT: Pt reports he was working w/ a lot of wheelchair users yesterday and it aggravated his wrist Pt accompanied by: self  PERTINENT HISTORY: 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 Frazier Butt: "At this point, we will try another corticosteroid injection and I will refer him to hand therapy to have a better fitting Thermoplast brace made and work on grip strength and adaptive strategies to help with his wrist pain."  PAIN: Are you having pain? No  FALLS: Has patient fallen in last 6 months? 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); non-emergency medical transport vehicle driver  PATIENT GOALS: Decrease pain and improve functional use of UEs  OBJECTIVE:   ------------------------------------------------------------------------------------------------------------------------------------------------------ MEASUREMENTS BELOW TAKEN AT TIME OF EVALUATION UNLESS DESIGNATED OTHERWISE  HAND DOMINANCE: Right  FUNCTIONAL OUTCOME MEASURES: Quick Dash: 68.2/100  (severe functional impairment)  UPPER EXTREMITY ROM     Active ROM Right Eval - 8/11 Left Eval - 8/11  Wrist flexion 32 20  Wrist extension 57 30  Wrist ulnar deviation 32 21  Wrist radial deviation 20 12  Wrist pronation 67 56  Wrist supination 49 55  (Blank rows = not tested)  ------------------------------------------------------------------------------------------------------------------------------------------------------  TODAY'S TREATMENT:  Orthosis Management Fabricated custom forearm-based thumb spica wrist immobilization orthosis using 1/8" perforated material to immobilize L wrist and thumb CMC/MCP and help limit swelling. Pt positioned w/ wrist in neutral and thumb midway between palmar and radial abduction w/ IP joint free; able to oppose thumb to index finger. Forearm trough adjusted after initial fit to improve wrist positioning via increased extension. Orthosis fit well w/ no areas of pressure; pt confirms a comfortable fit. All edges smoothed and rounded w/ additional stockinette administered to pt. Pt educated on common areas of irritation and instructed to call/return if it is causing any irritation or is not achieving desired function. Orthosis will be monitored and adjusted in upcoming sessions prn.    Material used: Tailor   Padding: None  Strapping: 3 Areas of involvement:  wrist and thumb Wear schedule: as much as  able, particularly during activity      PATIENT EDUCATION: Education provided on purpose of orthosis, wear and care, as well as potential signs and symptoms of irritation or inadequate fit to be aware of; handout reviewed and administered to pt at conclusion of session Person educated: Patient Education method: Explanation Education comprehension: verbalized understanding   HOME EXERCISE PROGRAM: To be administered  GOALS: Goals reviewed with patient? No  SHORT TERM GOALS: Target date:  12/16/21       Status:  1 Pt will demonstrate independence w/ wear and care of custom-fabricated orthosis Baseline: to be administered next session Progressing  2 Pt will demonstrate decreased edema circumferentially around L wrist to 19.9 cm or less for improved pain and ROM Baseline: Right: 19.4 cm, Left: 20.2 cm Progressing  3 Pt will increase functional use of L, dominant UE during all ADLs as evidenced by improving QuickDASH score to 60% or better, indicating moderate functional impairment Baseline: 68.2/100 Progressing    LONG TERM GOALS: Target date:  01/17/22       Status:  1 Pt will improve AROM of L wrist extension to at least 40 degrees to restore functional motion for tasks like reach and grasp  Baseline: Right: 57 deg, Left: 30 deg Progressing  2 Pt will demonstrate independence w/ HEP designed for increased ROM and strength of L wrist and hand Baseline: to be administered Progressing  3 Pt will improve grip strength in L hand by 25 lbs with pain less than 3/10 for increased functional use during IADLs and work-related tasks Baseline: Right: 94 lbs; Left: 23 lbs Progressing  4 Pt will increase functional use of L, dominant UE during all ADLs as evidenced by improving QuickDASH score to 40% or better, indicating minimal functional impairment Baseline: 68.2/100 Progressing    ASSESSMENT:  CLINICAL IMPRESSION: Pt arrives for first treatment session following initial evaluation for  persistent L wrist pain 2/2 arthritis consistent w/ SLAC wrist. Pt was referred by Dr. Frazier Butt for fabrication of custom fabricated wrist immobilization orthosis, indicated to prevent movement, and protect and facilitate healing for several weeks. Pt arrived to OT session today for fabrication of thumb spica, selected for maximum protection of involved structures w/ pt instructed to wear administered orthosis as much as able, particularly during work or heavier IADLs w/ further relevant education provided prn. Time pt spent during resting periods or waiting for materials/equipment to become available or modified not charged.  PERFORMANCE DEFICITS in functional skills including edema, ROM, strength, pain, body mechanics, decreased knowledge of precautions, and UE functional use.  IMPAIRMENTS are limiting patient from ADLs, IADLs, rest and sleep, and work.   COMORBIDITIES has co-morbidities such as R elbow pain and LBP  that affects occupational performance. Patient will benefit from skilled OT to address above impairments and improve overall function.   PLAN: OT FREQUENCY: 1x/week  OT DURATION: 8 weeks  PLANNED INTERVENTIONS: self care/ADL training, therapeutic exercise, therapeutic activity, manual therapy, passive range of motion, splinting, electrical stimulation, ultrasound, iontophoresis, paraffin, fluidotherapy, compression bandaging, moist heat, cryotherapy, patient/family education, and DME and/or AE instructions  RECOMMENDED OTHER SERVICES: None  CONSULTED AND AGREED WITH PLAN OF CARE: Patient  PLAN FOR NEXT SESSION: Assess fit of orthosis; increase wrist ext? Review joint protection and introduce HEP   Rosie Fate, MSOT, OTR/L 11/24/2021, 7:19 PM

## 2021-11-24 NOTE — Telephone Encounter (Signed)
Patient called back asking about the brace  he requested for his elbow. Please see previous note. The number to contact patient is [phone]

## 2021-11-28 ENCOUNTER — Encounter: Payer: Self-pay | Admitting: Occupational Therapy

## 2021-11-28 ENCOUNTER — Ambulatory Visit: Payer: Medicaid Other | Admitting: Occupational Therapy

## 2021-11-28 DIAGNOSIS — M25532 Pain in left wrist: Secondary | ICD-10-CM | POA: Diagnosis not present

## 2021-11-28 DIAGNOSIS — R600 Localized edema: Secondary | ICD-10-CM

## 2021-11-28 DIAGNOSIS — M6281 Muscle weakness (generalized): Secondary | ICD-10-CM

## 2021-11-28 DIAGNOSIS — M25632 Stiffness of left wrist, not elsewhere classified: Secondary | ICD-10-CM

## 2021-11-28 NOTE — Therapy (Signed)
OUTPATIENT OCCUPATIONAL THERAPY TREATMENT NOTE  Patient Name: Micheal Garcia MRN: [id number] DOB:1961/01/05, 61 y.o., male Today's Date: 11/28/2021  PCP: Verlon Au, MD   REFERRING PROVIDER: Marlyne Beards, MD   OT End of Session - 11/28/21 1622     Visit Number 3    Date for OT Re-Evaluation 01/17/22    Authorization Type [place] [employer]    Authorization Time Period VL: 27 combined OT/PT/ST    Authorization - Visit Number 3    Authorization - Number of Visits 27    OT Start Time 1620    OT Stop Time 1706    OT Time Calculation (min) 46 min    Activity Tolerance Patient limited by pain    Behavior During Therapy [facility] for tasks assessed/performed            History reviewed. No pertinent past medical history. History reviewed. No pertinent surgical history.  Patient Active Problem List   Diagnosis Date Noted   Arthritis of wrist, left 01/11/2021   Degenerative arthritis of right elbow 01/11/2021    ONSET DATE: 11/04/2021 (date of OT order)  REFERRING DIAG: M19.032 (ICD-10-CM) - Arthritis of wrist, left  THERAPY DIAG:  Pain in left wrist  Stiffness of left wrist, not elsewhere classified  Muscle weakness (generalized)  Localized edema  Rationale for Evaluation and Treatment Rehabilitation  SUBJECTIVE:   SUBJECTIVE STATEMENT: Pt reports feeling like the primary reason his L wrist is hurting is because his R elbow was hurting so badly that he was needing to compensate a lot by using his non-dominant side Pt accompanied by: self  PERTINENT HISTORY: 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 Frazier Butt: "At this point, we will try another corticosteroid injection and I will refer him to hand therapy to have a better fitting Thermoplast brace made and work on grip strength and adaptive strategies to help with his wrist pain."  PAIN: Are you having pain? 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); non-emergency medical transport vehicle driver  PATIENT GOALS: Decrease pain and improve functional use of UEs  OBJECTIVE:   ------------------------------------------------------------------------------------------------------------------------------------------------------ MEASUREMENTS BELOW TAKEN AT TIME OF EVALUATION UNLESS DESIGNATED OTHERWISE  HAND DOMINANCE: Right  FUNCTIONAL OUTCOME MEASURES: Quick Dash: 68.2/100  (severe functional impairment)  UPPER EXTREMITY ROM     Active ROM Right Eval - 8/11 Left Eval - 8/11  Wrist flexion 32 20  Wrist extension 57 30  Wrist ulnar deviation 32 21  Wrist radial deviation 20 12  Wrist pronation 67 56  Wrist supination 49 55  (Blank rows = not tested)  ------------------------------------------------------------------------------------------------------------------------------------------------------  TODAY'S TREATMENT:  Ice Pack Cold pack applied to dorsal wrist/hand for 10 min for pain management and to decrease swelling at start of session simultaneously w/ provision of joint protection strategies education  Joint Protection Education provided on joint protection principles and strategies for the hands, including: limiting or adapting activities that are painful, incorporating rest breaks as needed/balancing activity w/ rest, relying on larger muscles and muscle groups when able, and identifying using compensatory strategies and/or adaptive equipment, including custom orthosis and brace; corresponding handout provided  Orthosis Management Adjusted forearm trough of  custom  forearm-based thumb spica immobilization orthosis administered last session to improve wrist positioning via increased extension. Orthosis fit well after modification w/ no areas of pressure and pt confirmed a comfortable fit. Fit of othosis will continue to be monitored to ensure it is achieving desired function and adjusted accordingly if needed.   AROM Tendon gliding (open hand, hook fist, full fist, MPJ flexion, straight/flat fist) x5 for L hand completed in wrist immobilization orthosis; OT providing initial demo and verbal cues prn for understanding, gradation, and pacing  Gentle AROM of L wrist flex/ext w/ forearm in neutral on tabletop completed 2x10 w/ fingers relaxed to facilitate tenodesis; able to complete within tolerable level of pain w/ OT providing verbal cues and education to avoid pushing past point of pain  Putty Gross grasp of putty w/ L hand, focusing on attempting movement pattern against resistance w/out increased pain opposed to straining to force movement; completed 10x     PATIENT EDUCATION: Ongoing condition-specific education, particularly related to pain and edema management strategies as well as importance of moving within pain-free range and focusing on gentle movement vs strengthening and muscle endurance; pt verbalized understanding Person educated: Patient Education method: Explanation Education comprehension: verbalized understanding   HOME EXERCISE PROGRAM: Access Code: [email or web address] URL: [email or web address] Exercises: - Wrist AROM Flexion Extension  - 3 x daily - 1 sets - 15 reps - Putty Squeezes  - 3 x daily - 1 sets - 15 reps  GOALS: Goals reviewed with patient? No  SHORT TERM GOALS: Target date:  12/16/21       Status:  1 Pt will demonstrate independence w/ wear and care of custom-fabricated orthosis Baseline: to be administered next session Progressing  2 Pt will demonstrate decreased edema circumferentially around L wrist to 19.9 cm or  less for improved pain and ROM Baseline: Right: 19.4 cm, Left: 20.2 cm Progressing  3 Pt will increase functional use of L, dominant UE during all ADLs as evidenced by improving QuickDASH score to 60% or better, indicating moderate functional impairment Baseline: 68.2/100 Progressing    LONG TERM GOALS: Target date:  01/17/22       Status:  1 Pt will improve AROM of L wrist extension to at least 40 degrees to restore functional motion for tasks like reach and grasp  Baseline: Right: 57 deg, Left: 30 deg Progressing  2 Pt will demonstrate independence w/ HEP designed for increased ROM and strength of L wrist and hand Baseline: to be administered Progressing  3 Pt will improve grip strength in L hand by 25 lbs with pain less than 3/10 for increased functional use during IADLs and work-related tasks Baseline: Right: 94 lbs; Left: 23 lbs Progressing  4 Pt will increase functional use of L, dominant UE during all ADLs as evidenced by improving QuickDASH score to 40% or better, indicating minimal functional impairment Baseline: 68.2/100 Progressing    ASSESSMENT:  CLINICAL IMPRESSION: Pt continues to report significant pain, but no difficulty w/ custom orthosis administered in prior session. OT applied ice pack at start of session due to mild edema observed on dorsal side of wrist around region of radial scaphoid articulation; pt w/ tenderness to palpation this session. OT then introduced edema management and joint protection strategies to facilitate decreased pain and symptom management w/ pt verbalizing understanding. OT also adjusted amount of wrist extension within custom orthosis for improved functional positioning and support of affected structures. Pt able to tolerate light AROM exercises for hand and wrist flex/ext, but  unable to tolerate ulnar deviation. Will continue to progress w/ light AROM to decrease stiffness without incr pain and light strengthening as able.  PERFORMANCE DEFICITS in  functional skills including edema, ROM, strength, pain, body mechanics, decreased knowledge of precautions, and UE functional use.  IMPAIRMENTS are limiting patient from ADLs, IADLs, rest and sleep, and work.   COMORBIDITIES has co-morbidities such as R elbow pain and LBP  that affects occupational performance. Patient will benefit from skilled OT to address above impairments and improve overall function.   PLAN: OT FREQUENCY: 1x/week  OT DURATION: 8 weeks  PLANNED INTERVENTIONS: self care/ADL training, therapeutic exercise, therapeutic activity, manual therapy, passive range of motion, splinting, electrical stimulation, ultrasound, iontophoresis, paraffin, fluidotherapy, compression bandaging, moist heat, cryotherapy, patient/family education, and DME and/or AE instructions  RECOMMENDED OTHER SERVICES: None  CONSULTED AND AGREED WITH PLAN OF CARE: Patient  PLAN FOR NEXT SESSION: Assess fit of orthosis; review HEP and progress w/ light AROM and putty exercises   Rosie Fate, MSOT, OTR/L 11/28/2021, 5:23 PM

## 2021-12-06 ENCOUNTER — Telehealth: Payer: Self-pay | Admitting: Orthopedic Surgery

## 2021-12-06 ENCOUNTER — Ambulatory Visit: Payer: Medicaid Other | Admitting: Occupational Therapy

## 2021-12-06 DIAGNOSIS — M25532 Pain in left wrist: Secondary | ICD-10-CM | POA: Diagnosis not present

## 2021-12-06 DIAGNOSIS — R600 Localized edema: Secondary | ICD-10-CM

## 2021-12-06 DIAGNOSIS — M6281 Muscle weakness (generalized): Secondary | ICD-10-CM

## 2021-12-06 DIAGNOSIS — M25632 Stiffness of left wrist, not elsewhere classified: Secondary | ICD-10-CM

## 2021-12-06 NOTE — Therapy (Signed)
OUTPATIENT OCCUPATIONAL THERAPY TREATMENT NOTE  Patient Name: Micheal Garcia MRN: [id number] DOB:16-Mar-1961, 61 y.o., male Today's Date: 12/06/2021  PCP: Drue Flirt, MD   REFERRING PROVIDER: Sherilyn Cooter, MD   OT End of Session - 12/06/21 1620     Visit Number 4    Date for OT Re-Evaluation 01/17/22    Authorization Type [place] [employer]    Authorization Time Period VL: 27 combined OT/PT/ST    Authorization - Visit Number 4    Authorization - Number of Visits 27    OT Start Time 4431    OT Stop Time 1700    OT Time Calculation (min) 45 min    Activity Tolerance Patient limited by pain    Behavior During Therapy [facility] for tasks assessed/performed            No past medical history on file. No past surgical history on file.  Patient Active Problem List   Diagnosis Date Noted   Arthritis of wrist, left 01/11/2021   Degenerative arthritis of right elbow 01/11/2021    ONSET DATE: 11/04/2021 (date of OT order)  REFERRING DIAG: M19.032 (ICD-10-CM) - Arthritis of wrist, left  THERAPY DIAG:  Pain in left wrist  Stiffness of left wrist, not elsewhere classified  Muscle weakness (generalized)  Localized edema  Rationale for Evaluation and Treatment Rehabilitation  SUBJECTIVE:   SUBJECTIVE STATEMENT: "It's been a rough day because it's been cloudy;" pt also reports he bothered it a little on Saturday because he was at a family event and held some lightweight bean bags w/ his L hand Pt accompanied by: self  PERTINENT HISTORY: 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 Tempie Donning: "At this point, we will try another corticosteroid injection and I will refer him to hand therapy to have a better fitting Thermoplast brace made and work on grip strength and adaptive strategies to help with his wrist pain."  PAIN: Are you having pain? 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); non-emergency medical transport vehicle driver  PATIENT GOALS: Decrease pain and improve functional use of UEs   OBJECTIVE:  ------------------------------------------------------------------------------------------------------------------------------------------------------ MEASUREMENTS BELOW TAKEN AT TIME OF EVALUATION UNLESS DESIGNATED OTHERWISE  HAND DOMINANCE: Right  UPPER EXTREMITY ROM     Active ROM Right Eval - 8/11 Left Eval - 8/11  Wrist flexion 32 20  Wrist extension 57 30  Wrist ulnar deviation 32 21  Wrist radial deviation 20 12  Wrist pronation 67 56  Wrist supination 49 55  (Blank rows = not tested)  ------------------------------------------------------------------------------------------------------------------------------------------------------  TODAY'S TREATMENT:  12/06/21 Soft Tissue Massage Gentle soft tissue mobilization w/ lotion to dorsal/radial aspect of wrist and hand in order to facilitate mobility within tissue structures and decrease tension, edema, and pain; pt tolerated intervention w/out discomfort. OT provided relevant education prn.  AROM Attempted AROM of L wrist flex/ext and radial/ulnar deviation, but was unable to tolerate due to incr pain  PROM OT performed gentle PROM of L wrist flex/ext, wrist ulnar/rad deviation, and forearm sup/pro. Able to achieve good deviation and ext w/ limited wrist flex and forearm movement due to pain.  Facilitated self-PROM of wrist flex/ext and ulnar/rad deviation, problem-solving  w/ pt to identify most effective and comfortable positioning; corresponding handout provided (see HEP section  below)  Orthosis Management Adjusted ulnar border of custom forearm-based thumb spica immobilization orthosis incr support provided at the wrist and prevent wrist deviation. Also reattached thumb post and 2 pieces of loose velcro. Orthosis fit well after modification w/ no areas of pressure and pt confirmed a comfortable fit. Fit of othosis will continue to be monitored to ensure it is achieving desired function and adjusted accordingly if needed.    PATIENT EDUCATION: Ongoing condition-specific education, particularly related to HEP and importance of moving within pain-free range, focusing on gentle movement vs strengthening and muscle endurance Person educated: Patient Education method: Explanation Education comprehension: verbalized understanding   HOME EXERCISE PROGRAM: Access Code: [email or web address] URL: [email or web address] Exercises - Putty Squeezes  - 3 x daily - 1 sets - 15 reps - Seated Wrist Flexion Extension PROM  - 3 x daily - 1 sets - 15 reps - Wrist AROM Flexion Extension  - 3 x daily - 1 sets - 15 reps - Seated Wrist Radial Ulnar Deviation PROM  - 3 x daily - 1 sets - 15 reps - Seated Wrist Radial and Ulnar Deviation AROM  - 3 x daily - 1 sets - 15 reps  GOALS: Goals reviewed with patient? Yes  SHORT TERM GOALS: Target date:  12/16/21       Status:  1 Pt will demonstrate independence w/ wear and care of custom-fabricated orthosis Baseline: to be administered next session Met - 12/06/21  2 Pt will demonstrate decreased edema circumferentially around L wrist to 19.9 cm or less for improved pain and ROM Baseline: Right: 19.4 cm, Left: 20.2 cm Progressing  3 Pt will increase functional use of L, dominant UE during all ADLs as evidenced by improving QuickDASH score to 60% or better, indicating moderate functional impairment Baseline: 68.2/100 Progressing    LONG TERM GOALS: Target date:  01/17/22       Status:  1 Pt will improve AROM of L wrist extension to at  least 40 degrees to restore functional motion for tasks like reach and grasp  Baseline: Right: 57 deg, Left: 30 deg Progressing  2 Pt will demonstrate independence w/ HEP designed for increased ROM and strength of L wrist and hand Baseline: to be administered Progressing  3 Pt will improve grip strength in L hand by 25 lbs with pain less than 3/10 for increased functional use during IADLs and work-related tasks Baseline: Right: 94 lbs; Left: 23 lbs Progressing  4 Pt will increase functional use of L, dominant UE during all ADLs as evidenced by improving QuickDASH score to 40% or better, indicating minimal functional impairment Baseline: 68.2/100 Progressing    ASSESSMENT:  CLINICAL IMPRESSION: Session today focused on achieving ROM w/out inducing/aggravating pain. Started w/ soft tissue massage prior to attempting exercises to facilitate increased mobility and decreased pain/discomfort w/ positive results. Then focused on gentle PROM due to pain when attempting wrist AROM w/ pt able to tolerate exercises w/out incr pain. OT also completed minor fit adjustments to custom-fabricated orthosis due to pt's report that his wrist continues to both him when he is driving and OT observing insufficient support at wrist w/ pt able to achieve some wrist rad/ulnar deviation. May benefit from new orthosis that provides more support at forearm trough, but current orthosis will be reassessed next session after adjustments completed today. Pt will benefit from continued OT services to progress w/ ROM for decreased stiffness without  increased pain, light strengthening as able, and introduction/practice w/ compensatory strategies including AE prn to decrease demand on wrist and maximize functional use of LUE.  PERFORMANCE DEFICITS in functional skills including edema, ROM, strength, pain, body mechanics, decreased knowledge of precautions, and UE functional use.  IMPAIRMENTS are limiting patient from ADLs, IADLs, rest  and sleep, and work.   COMORBIDITIES has co-morbidities such as R elbow pain and LBP  that affects occupational performance. Patient will benefit from skilled OT to address above impairments and improve overall function.   PLAN: OT FREQUENCY: 1x/week  OT DURATION: 8 weeks  PLANNED INTERVENTIONS: self care/ADL training, therapeutic exercise, therapeutic activity, manual therapy, passive range of motion, splinting, electrical stimulation, ultrasound, iontophoresis, paraffin, fluidotherapy, compression bandaging, moist heat, cryotherapy, patient/family education, and DME and/or AE instructions  RECOMMENDED OTHER SERVICES: None  CONSULTED AND AGREED WITH PLAN OF CARE: Patient  PLAN FOR NEXT SESSION: Assess fit of orthosis; review HEP and progress w/ light AROM and putty exercises   Kathrine Cords, MSOT, OTR/L 12/06/2021, 5:59 PM

## 2021-12-06 NOTE — Telephone Encounter (Signed)
Patient called in stating his MRI order was denied being insurance says the has to make progress with PT first and he needs a 6 week program and can we send a referral to the same place he is doing OT on [facility] hand [facility], please advise

## 2021-12-07 ENCOUNTER — Other Ambulatory Visit: Payer: Self-pay | Admitting: Orthopedic Surgery

## 2021-12-07 DIAGNOSIS — G8929 Other chronic pain: Secondary | ICD-10-CM

## 2021-12-07 NOTE — Telephone Encounter (Signed)
Pt informed

## 2021-12-07 NOTE — Telephone Encounter (Signed)
Referral placed for lumbar spine PT for 6 weeks of therapy

## 2021-12-13 ENCOUNTER — Ambulatory Visit: Payer: Medicaid Other | Attending: Orthopedic Surgery | Admitting: Occupational Therapy

## 2021-12-13 ENCOUNTER — Encounter: Payer: Self-pay | Admitting: Occupational Therapy

## 2021-12-13 DIAGNOSIS — R600 Localized edema: Secondary | ICD-10-CM | POA: Insufficient documentation

## 2021-12-13 DIAGNOSIS — M25632 Stiffness of left wrist, not elsewhere classified: Secondary | ICD-10-CM | POA: Diagnosis present

## 2021-12-13 DIAGNOSIS — M25532 Pain in left wrist: Secondary | ICD-10-CM | POA: Diagnosis present

## 2021-12-13 DIAGNOSIS — M6281 Muscle weakness (generalized): Secondary | ICD-10-CM | POA: Insufficient documentation

## 2021-12-18 NOTE — Therapy (Unsigned)
OUTPATIENT OCCUPATIONAL THERAPY TREATMENT NOTE  Patient Name: Micheal Garcia MRN: [id number] DOB:30-Sep-1960, 61 y.o., male Today's Date: 12/18/2021  PCP: Drue Flirt, MD   REFERRING PROVIDER: Sherilyn Cooter, MD    OT End of Session - 12/13/21 1618       Visit Number 5    Date for OT Re-Evaluation 01/17/22     Authorization Type [place] [employer]     Authorization Time Period VL: 52 combined OT/PT/ST     Authorization - Visit Number 1    Authorization - Number of Visits 8    OT Start Time 1616    OT Stop Time 1700     OT Time Calculation (min) 44 min     Activity Tolerance Patient limited by pain     Behavior During Therapy [facility] for tasks assessed/performed            History reviewed. No pertinent past medical history. History reviewed. No pertinent surgical history.  Patient Active Problem List   Diagnosis Date Noted   Arthritis of wrist, left 01/11/2021   Degenerative arthritis of right elbow 01/11/2021    ONSET DATE: 11/04/2021 (date of OT order)  REFERRING DIAG: M19.032 (ICD-10-CM) - Arthritis of wrist, left  THERAPY DIAG:  Pain in left wrist  Stiffness of left wrist, not elsewhere classified  Muscle weakness (generalized)  Localized edema  Rationale for Evaluation and Treatment Rehabilitation  SUBJECTIVE:   SUBJECTIVE STATEMENT: Pt reports he does a lot of heavy lifting during events with his church and is concerned the brace is not supportive enough Pt accompanied by: self  PERTINENT HISTORY: 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 Tempie Donning: "At this point, we will try another corticosteroid injection and I will refer him to hand therapy to have a better fitting Thermoplast brace made and work on grip strength and adaptive strategies to help with his wrist pain."  PAIN: Are you having pain? 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); non-emergency medical transport vehicle driver  PATIENT GOALS: Decrease pain and improve functional use of UEs  OBJECTIVE:   ------------------------------------------------------------------------------------------------------------------------------------------------------ MEASUREMENTS BELOW TAKEN AT TIME OF EVALUATION UNLESS DESIGNATED OTHERWISE  HAND DOMINANCE: Right  UPPER EXTREMITY ROM     Active ROM Right Eval - 8/11 Left Eval - 8/11  Wrist flexion 32 20  Wrist extension 57 30  Wrist ulnar deviation 32 21  Wrist radial deviation 20 12  Wrist pronation 67 56  Wrist supination 49 55  (Blank rows = not tested)  ------------------------------------------------------------------------------------------------------------------------------------------------------  TODAY'S TREATMENT:  Orthosis Management Fabricated new custom forearm-based thumb spica wrist immobilization orthosis using 1/8" perforated material to immobilize L wrist and thumb CMC/MCP and help limit swelling. Pt positioned w/ wrist in slight extension and thumb midway between palmar and radial abduction w/ IP joint free; able to oppose thumb to index finger. Forearm trough wider on both sides to increase support and prevent wrist radial/ulnar deviation. Orthosis fit well w/ no areas of pressure; pt confirms a comfortable fit. All edges smoothed and rounded w/ additional stockinette administered to pt. Orthosis will be monitored and adjusted in upcoming sessions prn.    Material used: Tailor   Padding: None  Strapping: 3 Areas of involvement: wrist and thumb Wear schedule: as much as able, particularly during heavier activity/lifting    PATIENT EDUCATION: Ongoing condition-specific education related to therapeutic interventions completed this session Person educated: Patient Education method: Explanation  Education comprehension: verbalized understanding   HOME EXERCISE PROGRAM: Access  Code: [email or web address] URL: [email or web address] Exercises - Putty Squeezes  - 3 x daily - 1 sets - 15 reps - Seated Wrist Flexion Extension PROM  - 3 x daily - 1 sets - 15 reps - Wrist AROM Flexion Extension  - 3 x daily - 1 sets - 15 reps - Seated Wrist Radial Ulnar Deviation PROM  - 3 x daily - 1 sets - 15 reps - Seated Wrist Radial and Ulnar Deviation AROM  - 3 x daily - 1 sets - 15 reps  GOALS: Goals reviewed with patient? Yes  SHORT TERM GOALS: Target date:  12/16/21       Status:  1 Pt will demonstrate independence w/ wear and care of custom-fabricated orthosis Baseline: to be administered next session Met - 12/06/21  2 Pt will demonstrate decreased edema circumferentially around L wrist to 19.9 cm or less for improved pain and ROM Baseline: Right: 19.4 cm, Left: 20.2 cm Progressing  3 Pt will increase functional use of L, dominant UE during all ADLs as evidenced by improving QuickDASH score to 60% or better, indicating moderate functional impairment Baseline: 68.2/100 Progressing    LONG TERM GOALS: Target date:  01/17/22       Status:  1 Pt will improve AROM of L wrist extension to at least 40 degrees to restore functional motion for tasks like reach and grasp  Baseline: Right: 57 deg, Left: 30 deg Progressing  2 Pt will demonstrate independence w/ HEP designed for increased ROM and strength of L wrist and hand Baseline: to be administered Progressing  3 Pt will improve grip strength in L hand by 25 lbs with pain less than 3/10 for increased functional use during IADLs and work-related tasks Baseline: Right: 94 lbs; Left: 23 lbs Progressing  4 Pt will increase functional use of L, dominant UE during all ADLs as evidenced by improving QuickDASH score to 40% or better, indicating minimal functional impairment Baseline: 68.2/100 Progressing    ASSESSMENT:  CLINICAL IMPRESSION: Session today w/ focus on readjustment of custom-fabricated wrist immobilization orthosis to  improve support during particular ADLs including driving and lifting/moving of objects. OT unable to modify pt's current orthosis to an appropriate fit, so new material was cut and molded; increased width of forearm trough, decreased height of palmar metacarpal bar, and slightly increased wrist extension. Much improved fit w/ pt confirming feel of increased support after completion. Pt will benefit from continued OT services to progress w/ ROM for decreased stiffness without increased pain, light strengthening as able, and introduction/practice w/ compensatory strategies including AE prn to decrease demand on wrist and maximize functional use of LUE.  PERFORMANCE DEFICITS in functional skills including edema, ROM, strength, pain, body mechanics, decreased knowledge of precautions, and UE functional use.  IMPAIRMENTS are limiting patient from ADLs, IADLs, rest and sleep, and work.   COMORBIDITIES has co-morbidities such as R elbow pain and LBP  that affects occupational performance. Patient will benefit from skilled OT to address above impairments and improve overall function.   PLAN: OT FREQUENCY: 1x/week  OT DURATION: 8 weeks  PLANNED INTERVENTIONS: self care/ADL training, therapeutic exercise, therapeutic activity, manual therapy, passive range of motion, splinting, electrical stimulation, ultrasound, iontophoresis, paraffin, fluidotherapy, compression bandaging, moist heat, cryotherapy, patient/family education, and DME and/or AE instructions  RECOMMENDED OTHER SERVICES: None  CONSULTED AND AGREED WITH PLAN OF CARE: Patient  PLAN FOR NEXT SESSION: Assess fit of orthosis; progress w/ light AROM  and putty prn   Kathrine Cords, MSOT, OTR/L 12/13/2021, 5:11 PM

## 2021-12-20 ENCOUNTER — Ambulatory Visit: Payer: Medicaid Other | Admitting: Occupational Therapy

## 2021-12-22 ENCOUNTER — Ambulatory Visit: Payer: Medicaid Other | Admitting: Occupational Therapy

## 2021-12-22 ENCOUNTER — Ambulatory Visit: Payer: Medicaid Other | Admitting: Physical Therapy

## 2021-12-22 DIAGNOSIS — M6281 Muscle weakness (generalized): Secondary | ICD-10-CM

## 2021-12-22 DIAGNOSIS — M25532 Pain in left wrist: Secondary | ICD-10-CM

## 2021-12-22 DIAGNOSIS — M25632 Stiffness of left wrist, not elsewhere classified: Secondary | ICD-10-CM

## 2021-12-22 DIAGNOSIS — R600 Localized edema: Secondary | ICD-10-CM

## 2021-12-22 NOTE — Therapy (Unsigned)
OUTPATIENT OCCUPATIONAL THERAPY TREATMENT NOTE  Patient Name: Micheal Garcia MRN: [id number] DOB:06/01/1960, 61 y.o., male Today's Date: 12/22/2021  PCP: Drue Flirt, MD   REFERRING PROVIDER: Sherilyn Cooter, MD   OT End of Session - 12/22/21 1655     Visit Number 6    Date for OT Re-Evaluation 01/17/22    Authorization Type [place] Medicaid [employer]    Authorization Time Period VL: 51 combined OT/PT/ST    Authorization - Visit Number 2    Authorization - Number of Visits 8    OT Start Time 2751    OT Stop Time 1725    OT Time Calculation (min) 40 min    Activity Tolerance Patient limited by pain    Behavior During Therapy [facility] for tasks assessed/performed            No past medical history on file. No past surgical history on file.  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 Tempie Donning: "At this point, we will try another corticosteroid injection and I will refer him to hand therapy to have a better fitting Thermoplast brace made and work on grip strength and adaptive strategies to help with his wrist pain."  PAIN: Are you having pain? 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[employer]); non-emergency medical transport vehicle driver  PATIENT GOALS:  Decrease pain and improve functional use of UEs  OBJECTIVE:   ------------------------------------------------------------------------------------------------------------------------------------------------------ MEASUREMENTS BELOW TAKEN AT TIME OF EVALUATION UNLESS DESIGNATED OTHERWISE  HAND DOMINANCE: Right  UPPER EXTREMITY ROM     Active ROM Right Eval - 8/11 Left Eval - 8/11  Wrist flexion 32 20  Wrist extension 57 30  Wrist ulnar deviation 32 21  Wrist radial deviation 20 12  Wrist pronation 67 56  Wrist supination 49 55  (Blank rows = not tested)  ------------------------------------------------------------------------------------------------------------------------------------------------------  TODAY'S TREATMENT:  Therapeutic Exercise Attempted gentle PROM of wrist radial/ulnar deviation and flex/ext to address joint stiffness w/ pt unable to tolerate due to pain. OT also reviewed gentle self-PROM and AROM exercises included in HEP; pt encouraged to continue gentle ROM exercises at home only within pain-free range and not attempting to force wrist movement.  Manual Therapy Gentle soft tissue mobilization w/ lotion to L wrist and hand in order to facilitate mobility within tissue structures and decrease edema and pain; also incorporated use of ice probe for further edema management. Pt tolerated intervention w/out discomfort throughout; some tenderness to palpation along dorsal side of wrist.  Self-Care Education provided regarding pain and edema management strategies, including: benefit of compression for edema, ice/cold packs, massage, and gentle ROM as able. Pt verbalized/demonstrated understanding of strategies    PATIENT EDUCATION: Ongoing condition-specific education related to therapeutic interventions completed this session Person educated: Patient Education method: Explanation Education comprehension: verbalized understanding   HOME EXERCISE PROGRAM: Access  Code:  [email or web address] URL: [email or web address] Exercises - Putty Squeezes  - 3 x daily - 1 sets - 15 reps - Seated Wrist Flexion Extension PROM  - 3 x daily - 1 sets - 15 reps - Wrist AROM Flexion Extension  - 3 x daily - 1 sets - 15 reps - Seated Wrist Radial Ulnar Deviation PROM  - 3 x daily - 1 sets - 15 reps - Seated Wrist Radial and Ulnar Deviation AROM  - 3 x daily - 1 sets - 15 reps  GOALS: Goals reviewed with patient? Yes  SHORT TERM GOALS: Target date:  12/16/21       Status:  1 Pt will demonstrate independence w/ wear and care of custom-fabricated orthosis Baseline: to be administered next session Met - 12/06/21  2 Pt will demonstrate decreased edema circumferentially around L wrist to 19.9 cm or less for improved pain and ROM Baseline: Right: 19.4 cm, Left: 20.2 cm Progressing  3 Pt will increase functional use of L, dominant UE during all ADLs as evidenced by improving QuickDASH score to 60% or better, indicating moderate functional impairment Baseline: 68.2/100 Progressing    LONG TERM GOALS: Target date:  01/17/22       Status:  1 Pt will improve AROM of L wrist extension to at least 40 degrees to restore functional motion for tasks like reach and grasp  Baseline: Right: 57 deg, Left: 30 deg Progressing  2 Pt will demonstrate independence w/ HEP designed for increased ROM and strength of L wrist and hand Baseline: to be administered Progressing  3 Pt will improve grip strength in L hand by 25 lbs with pain less than 3/10 for increased functional use during IADLs and work-related tasks Baseline: Right: 94 lbs; Left: 23 lbs Progressing  4 Pt will increase functional use of L, dominant UE during all ADLs as evidenced by improving QuickDASH score to 40% or better, indicating minimal functional impairment Baseline: 68.2/100 Progressing    ASSESSMENT:  CLINICAL IMPRESSION: Pt continues to be limited by pain, but does report custom orthosis fits better after  readjustment last session. OT and pt then discussed psychological impacts of his persistent wrist pain, w/ OT encouraging pt to seek out support w/ a counselor or family/friends due to his report that dealing w/ chronic pain and the impacts it is having on his job have been difficult for him. OT then focused remainder of session on pain management w/ good results as pt reported less pain than when he first arrived for session today. Pt will benefit from continued OT services to progress w/ ROM for decreased stiffness without increased pain, light strengthening as able, and introduction/practice w/ compensatory strategies including AE prn to decrease demand on wrist and maximize functional use of LUE.  PERFORMANCE DEFICITS in functional skills including edema, ROM, strength, pain, body mechanics, decreased knowledge of precautions, and UE functional use.  IMPAIRMENTS are limiting patient from ADLs, IADLs, rest and sleep, and work.   COMORBIDITIES has co-morbidities such as R elbow pain and LBP  that affects occupational performance. Patient will benefit from skilled OT to address above impairments and improve overall function.   PLAN: OT FREQUENCY: 1x/week  OT DURATION: 8 weeks  PLANNED INTERVENTIONS: self care/ADL training, therapeutic exercise, therapeutic activity, manual therapy, passive range of motion, splinting, electrical stimulation, ultrasound, iontophoresis, paraffin, fluidotherapy, compression bandaging, moist heat, cryotherapy, patient/family education, and DME and/or AE instructions  RECOMMENDED OTHER SERVICES: None  CONSULTED AND AGREED WITH PLAN OF CARE: Patient  PLAN FOR  NEXT SESSION: Assess fit of orthosis; progress w/ light AROM and putty prn   Kathrine Cords, MSOT, OTR/L 12/22/21, 5:47 PM

## 2022-01-11 NOTE — Therapy (Signed)
OUTPATIENT PHYSICAL THERAPY THORACOLUMBAR EVALUATION   Patient Name: Micheal Garcia MRN: [id number] DOB:13-Jun-1960, 61 y.o., male Today's Date: 01/11/2022    No past medical history on file. No past surgical history on file. Patient Active Problem List   Diagnosis Date Noted   Arthritis of wrist, left 01/11/2021   Degenerative arthritis of right elbow 01/11/2021    PCP: Carmine Savoy  REFERRING PROVIDER: Meridee Score  REFERRING DIAG: M54.41, M54.42  Rationale for Evaluation and Treatment Rehabilitation  THERAPY DIAG:  No diagnosis found.  ONSET DATE: 12/07/21  SUBJECTIVE:                                                                                                                                                                                           SUBJECTIVE STATEMENT: In 2010 I had an incident with the lawn  In 2020 I had a situation where I couldn't get up out the chair, I went to the ED and they told me I had a herniated disc. 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   PERTINENT HISTORY:  No remarkable history  PAIN:  Are you having pain? Yes: NPRS scale: 7/10 Pain location: low back, sciatica on both sides it moves around and runs down into my feet Pain description: sharp, shooting, N/T, ache Aggravating factors: driving and sitting for too long, lifting heavy objects  Relieving factors: ibuprofen every now and then    PRECAUTIONS: None  WEIGHT BEARING RESTRICTIONS No  FALLS:  Has patient fallen in last 6 months? No  LIVING ENVIRONMENT: Lives with: lives with their spouse Lives in: House/apartment Stairs: Yes: External: 3 steps; none Has following equipment at home: None  OCCUPATION: [employer]  PLOF: Independent  PATIENT GOALS to feel better, not have back pain    OBJECTIVE:    SCREENING FOR RED  FLAGS: Bowel or bladder incontinence: No Spinal tumors: No Cauda equina syndrome: No Compression fracture: No Abdominal aneurysm: No  COGNITION:  Overall cognitive status: Within functional limits for tasks assessed   SENSATION: WFL  MUSCLE LENGTH: Hamstrings: very tight bilaterally   POSTURE: rounded shoulders and decreased lumbar lordosis  PALPATION: TPP "sore and tender" L1-L5  LUMBAR ROM:   Active  A/PROM  eval  Flexion To ankles, pain  Extension Mod limitations w/pain  Right lateral flexion Mid thigh   Left lateral flexion Mid thigh  Right rotation 70%  Left rotation 70%   (Blank rows = not tested)  LOWER EXTREMITY ROM:  grossly WFL   LOWER EXTREMITY MMT:    MMT Right eval Left eval  Hip flexion 4+ w/pain 5  Hip extension    Hip abduction 4+ 4+  Hip adduction    Hip internal rotation    Hip external rotation    Knee flexion 4+ w/pain 5  Knee extension 4+ w/pain 5  Ankle dorsiflexion 5 5  Ankle plantarflexion 5 5  Ankle inversion    Ankle eversion     (Blank rows = not tested)  LUMBAR SPECIAL TESTS:  Straight leg raise test: Positive, Slump test: Positive, and FABER test: Positive  FUNCTIONAL TESTS:  5 times sit to stand: 18.43s Timed up and go (TUG): 11.07s  GAIT: Distance walked: in clinic Assistive device utilized: None Level of assistance: Complete Independence Comments: antalgic gait, slowed speed, decreased stance time bilaterally    TODAY'S TREATMENT  Eval and POC   PATIENT EDUCATION:  Education details: POC and HEP Person educated: Patient Education method: Explanation Education comprehension: verbalized understanding   HOME EXERCISE PROGRAM: Access Code: [email or web address] URL: [email or web address] Date: 01/12/2022 Prepared by: Cassie Freer  Exercises - Supine Lower Trunk Rotation  - 1 x daily - 7 x weekly - 2 sets - 10 reps - Supine Bridge  - 1 x daily - 7 x weekly - 2 sets - 10 reps - Supine Sciatic Nerve  Glide  - 1 x daily - 7 x weekly - 1 sets - 10 reps - Supine Hamstring Stretch with Strap  - 2 x daily - 7 x weekly - 15-30 hold  ASSESSMENT:  CLINICAL IMPRESSION: Patient is a 61 y.o. male who was seen today for physical therapy evaluation and treatment for low back pain with sciatica bilaterally. He has been dealing with chronic low back pain for years that has progressively gotten worse. 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 Patient presents with high pain levels, increased tightness, decreased mobility, and some weakness. We worked on some gentle exercises to start at home as well as sciatic nerve glides to help with sciatica symptoms. Patient will benefit from skilled PT intervention to address pain, tightness, decreased mobility, and strength limitations to be able to work and complete ADLs without pain.   REHAB POTENTIAL: Good  CLINICAL DECISION MAKING: Stable/uncomplicated  EVALUATION COMPLEXITY: Low   GOALS: Goals reviewed with patient? Yes  SHORT TERM GOALS: Target date: 02/16/22  Patient will be independent with initial HEP.  Goal status: INITIAL  2.  Patient will report centralization of radicular symptoms.  Baseline: b/l sciatica  Goal status: INITIAL   LONG TERM GOALS: Target date: 03/23/22  Patient will be independent with advanced/ongoing HEP to improve outcomes and carryover.  Goal status: INITIAL  2.  Patient will report 75% improvement in low back pain to improve QOL.   Baseline: 7/10 Goal status: INITIAL  3.  Patient will demonstrate full pain free lumbar ROM to perform ADLs.   Goal status: INITIAL  4.  Patient will demonstrate improved functional strength as demonstrated by <13s with 5xSTS. Baseline: 18.43s Goal status: INITIAL    PLAN: PT FREQUENCY: 2x/week  PT DURATION: 10 weeks  PLANNED INTERVENTIONS: Therapeutic exercises, Therapeutic activity, Neuromuscular re-education, Balance training, Gait  training, Patient/Family education, Self Care, Joint mobilization, Stair training, Dry Needling, Electrical stimulation, Cryotherapy, Moist heat, Traction, Ionotophoresis 4mg /ml Dexamethasone, and Manual therapy.  PLAN FOR NEXT SESSION: repeated motions ([place]), stretching, low back mobility and light strengthening  Cassie Freer, PT 01/11/2022, 11:05 AM

## 2022-01-12 ENCOUNTER — Encounter: Payer: Self-pay | Admitting: Occupational Therapy

## 2022-01-12 ENCOUNTER — Ambulatory Visit: Payer: Medicaid Other | Attending: Orthopedic Surgery | Admitting: Occupational Therapy

## 2022-01-12 ENCOUNTER — Ambulatory Visit: Payer: Medicaid Other

## 2022-01-12 DIAGNOSIS — R2689 Other abnormalities of gait and mobility: Secondary | ICD-10-CM | POA: Diagnosis present

## 2022-01-12 DIAGNOSIS — G8929 Other chronic pain: Secondary | ICD-10-CM | POA: Insufficient documentation

## 2022-01-12 DIAGNOSIS — R600 Localized edema: Secondary | ICD-10-CM | POA: Diagnosis present

## 2022-01-12 DIAGNOSIS — M25532 Pain in left wrist: Secondary | ICD-10-CM | POA: Insufficient documentation

## 2022-01-12 DIAGNOSIS — M6281 Muscle weakness (generalized): Secondary | ICD-10-CM | POA: Insufficient documentation

## 2022-01-12 DIAGNOSIS — M5459 Other low back pain: Secondary | ICD-10-CM

## 2022-01-12 DIAGNOSIS — M5441 Lumbago with sciatica, right side: Secondary | ICD-10-CM | POA: Diagnosis present

## 2022-01-12 DIAGNOSIS — M25632 Stiffness of left wrist, not elsewhere classified: Secondary | ICD-10-CM | POA: Insufficient documentation

## 2022-01-12 NOTE — Therapy (Signed)
OUTPATIENT OCCUPATIONAL THERAPY TREATMENT NOTE  Patient Name: Micheal Garcia MRN: [id number] DOB:1960/09/29, 61 y.o., male Today's Date: 01/12/2022  PCP: Drue Flirt, MD   REFERRING PROVIDER: Sherilyn Cooter, MD   OT End of Session - 01/12/22 1701     Visit Number 7    Date for OT Re-Evaluation 01/17/22    Authorization Type [place] Medicaid [employer]    Authorization Time Period VL: 27 combined OT/PT/ST    Authorization - Visit Number 3    Authorization - Number of Visits 8    OT Start Time 1700    OT Stop Time 1740    OT Time Calculation (min) 40 min    Activity Tolerance Patient limited by pain    Behavior During Therapy [facility] for tasks assessed/performed            History reviewed. No pertinent past medical history. History reviewed. No pertinent surgical history.  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 Tempie Donning: "At this point, we will try another corticosteroid injection and I will refer him to hand therapy to have a better fitting Thermoplast brace made and work on grip strength and adaptive strategies to help with his wrist pain."  PAIN: Are you having pain?  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); non-emergency medical transport vehicle driver  PATIENT GOALS: Decrease pain and improve functional use of UEs  OBJECTIVE:   ------------------------------------------------------------------------------------------------------------------------------------------------------ MEASUREMENTS BELOW TAKEN AT TIME OF EVALUATION UNLESS DESIGNATED OTHERWISE  HAND DOMINANCE: Right  FUNCTIONAL OUTCOME MEASURES: QuickDASH - 11/18/21: 68.2/100 Moderate difficulty: carrying a shopping bag, limited in work/other regular daily activities, sleeping Severe difficulty: opening a tight/new jar, doing heavy household chores, washing your back, using a knife to cut food, recreational activities requiring force through UE, social activities, pain, tingling/pins and needles  UPPER EXTREMITY ROM     Active ROM Right Eval - 8/11 Left Eval - 8/11  Wrist flexion 32 20  Wrist extension 57 30  Wrist ulnar deviation 32 21  Wrist radial deviation 20 12  Wrist pronation 67 56  Wrist supination 49 55  (Blank rows = not tested)  ------------------------------------------------------------------------------------------------------------------------------------------------------  TODAY'S TREATMENT:  Ultrasound Ultrasound treatment applied to dorsal wrist and surrounding areas for 8 min to address scar tissue, relieve pain, and promote blood flow for tissue healing; denied contraindications or precautions. No adverse reaction observed or reported after application; skin intact.   Parameters: 3.3 MHz, 20% duty cycle, and intensity of 0.8 W/cm2   Therapeutic Exercise OT performed gentle PROM of wrist radial/ulnar deviation and dart-throwing motion to decrease stiffness for pain mgmt and increased ROM. PROM in all planes limited by pain, but able to complete 10x each slowly within pt tolerance  Manual Therapy Gentle soft tissue mobilization w/ lotion to L wrist and hand  in order to facilitate mobility  within tissue structures and decrease edema and pain. Pt tolerated intervention w/out discomfort; mild reported tenderness to palpation along dorsal side of wrist.    PATIENT EDUCATION: Education continued regarding pain and edema management, including: benefit of compression for edema, ice/cold packs, massage, wearing brace for support/protection when lifting or while driving for work, and gentle ROM as able within pain-free range Person educated: Patient Education method: Explanation Education comprehension: verbalized understanding   HOME EXERCISE PROGRAM: MedBridgeAccess Code: [email or web address] URL: [email or web address]  - Putty Squeezes  - 3 x daily - 1 sets - 15 reps - Seated Wrist Flexion Extension PROM  - 3 x daily - 1 sets - 15 reps - Wrist AROM Flexion Extension  - 3 x daily - 1 sets - 15 reps - Seated Wrist Radial Ulnar Deviation PROM  - 3 x daily - 1 sets - 15 reps - Seated Wrist Radial and Ulnar Deviation AROM  - 3 x daily - 1 sets - 15 reps **Pt instructed to focus on PROM as he is currently unable to complete AROM/light strengthening exercises w/out pain  GOALS: Goals reviewed with patient? Yes  SHORT TERM GOALS: Target date:  12/16/21       Status:  1 Pt will demonstrate independence w/ wear and care of custom-fabricated orthosis Baseline: to be administered next session Met - 12/06/21  2 Pt will demonstrate decreased edema circumferentially around L wrist to 19.9 cm or less for improved pain and ROM Baseline: Right: 19.4 cm, Left: 20.2 cm Progressing  3 Pt will increase functional use of L, dominant UE during all ADLs as evidenced by improving QuickDASH score to 60% or better, indicating moderate functional impairment Baseline: 68.2/100 Progressing    LONG TERM GOALS: Target date:  01/17/22       Status:  1 Pt will improve AROM of L wrist extension to at least 40 degrees to restore functional motion for tasks like reach and grasp  Baseline: Right: 57 deg,  Left: 30 deg Progressing  2 Pt will demonstrate independence w/ HEP designed for increased ROM and strength of L wrist and hand Baseline: to be administered Progressing  3 Pt will improve grip strength in L hand by 25 lbs with pain less than 3/10 for increased functional use during IADLs and work-related tasks Baseline: Right: 94 lbs; Left: 23 lbs Progressing  4 Pt will increase functional use of L, dominant UE during all ADLs as evidenced by improving QuickDASH score to 40% or better, indicating minimal functional impairment Baseline: 68.2/100 Progressing    ASSESSMENT:  CLINICAL IMPRESSION: Session today focused on pain management, gentle PROM, and review of previously discussed education due to pt's report of significant, persistent pain. At this time, pt has not demonstrated notable improvement in ROM or strength w/ pain and stiffness limiting progress toward functional goals. Pt to be put on hold from skilled occupational therapy services for about 2 weeks to allow time for follow-up w/ Dr. Tempie Donning for determining POC and/or continued necessity of continuing services. Pt encouraged to call back before that time with any changes or if any relevant functional deficits develop/occur. Pt to be discharged from OP OT if no further therapy is warranted by 02/02/22.  PERFORMANCE DEFICITS in functional skills including edema, ROM, strength, pain, body mechanics, decreased knowledge of precautions, and UE functional use.  IMPAIRMENTS are limiting patient from ADLs, IADLs, rest and sleep, and work.   COMORBIDITIES has co-morbidities such as R elbow pain and LBP  that affects occupational performance. Patient will benefit from skilled OT to address above impairments and improve overall function.   PLAN: OT FREQUENCY: 1x/week  OT DURATION: 8 weeks  PLANNED INTERVENTIONS: self care/ADL training, therapeutic exercise, therapeutic activity, manual therapy, passive range of motion, splinting,  electrical stimulation, ultrasound, iontophoresis, paraffin, fluidotherapy, compression bandaging, moist heat, cryotherapy, patient/family education, and DME and/or AE instructions  RECOMMENDED OTHER SERVICES: None  CONSULTED AND AGREED WITH PLAN OF CARE: Patient  PLAN FOR NEXT SESSION: Pt to be put on hold after today until after follow-up/clearance to return to therapy from MD   Kathrine Cords, [place], OTR/L 01/12/22, 5:56 PM

## 2022-01-17 ENCOUNTER — Ambulatory Visit: Payer: Medicaid Other | Admitting: Occupational Therapy

## 2022-01-24 ENCOUNTER — Ambulatory Visit: Payer: Medicaid Other | Admitting: Occupational Therapy

## 2022-01-26 ENCOUNTER — Ambulatory Visit: Payer: Medicaid Other

## 2022-01-31 ENCOUNTER — Ambulatory Visit: Payer: Medicaid Other | Admitting: Occupational Therapy

## 2022-02-02 ENCOUNTER — Ambulatory Visit: Payer: Medicaid Other

## 2022-02-03 NOTE — Pre-Procedure Instructions (Signed)
Surgical Instructions    Your procedure is scheduled on Thursday, November 2nd.  Report to [facility] Main Entrance "A" at 11:00 A.M., then check in with the Admitting office.  Call this number if you have problems the morning of surgery:  [phone]   If you have any questions prior to your surgery date call [phone]: Open Monday-Friday 8am-4pm    Remember:  Do not eat after midnight the night before your surgery  You may drink clear liquids until 10:00 a.m. the morning of your surgery.   Clear liquids allowed are: Water, Non-Citrus Juices (without pulp), Carbonated Beverages, Clear Tea, Black Coffee Only (NO MILK, CREAM OR POWDERED CREAMER of any kind), and Gatorade.    Take these medicines the morning of surgery with A SIP OF WATER  atorvastatin (LIPITOR)   As of today, STOP taking any Aspirin (unless otherwise instructed by your surgeon) Aleve, Naproxen, Ibuprofen, Motrin, Advil, Goody's, BC's, all herbal medications, fish oil, and all vitamins.  WHAT DO I DO ABOUT MY DIABETES MEDICATION?   Do not take metFORMIN (GLUCOPHAGE) the morning of surgery.  HOLD JARDIANCE for 72 hours prior to surgery. Last dose should be Sunday, October 29th.    THE NIGHT BEFORE SURGERY, take 5 units of Levemir insulin.    THE MORNING OF SURGERY, take 5 units of Levemir insulin.   HOW TO MANAGE YOUR DIABETES BEFORE AND AFTER SURGERY  Why is it important to control my blood sugar before and after surgery? Improving blood sugar levels before and after surgery helps healing and can limit problems. A way of improving blood sugar control is eating a healthy diet by:  Eating less sugar and carbohydrates  Increasing activity/exercise  Talking with your doctor about reaching your blood sugar goals High blood sugars (greater than 180 mg/dL) can raise your risk of infections and slow your recovery, so you will need to focus on controlling your diabetes during the weeks before surgery. Make sure  that the doctor who takes care of your diabetes knows about your planned surgery including the date and location.  How do I manage my blood sugar before surgery? Check your blood sugar at least 4 times a day, starting 2 days before surgery, to make sure that the level is not too high or low.  Check your blood sugar the morning of your surgery when you wake up and every 2 hours until you get to the Short Stay unit.  If your blood sugar is less than 70 mg/dL, you will need to treat for low blood sugar: Do not take insulin. Treat a low blood sugar (less than 70 mg/dL) with  cup of clear juice (cranberry or apple), 4 glucose tablets, OR glucose gel. Recheck blood sugar in 15 minutes after treatment (to make sure it is greater than 70 mg/dL). If your blood sugar is not greater than 70 mg/dL on recheck, call [phone] for further instructions. Report your blood sugar to the short stay nurse when you get to Short Stay.  If you are admitted to the hospital after surgery: Your blood sugar will be checked by the staff and you will probably be given insulin after surgery (instead of oral diabetes medicines) to make sure you have good blood sugar levels. The goal for blood sugar control after surgery is 80-180 mg/dL.                      Do NOT Smoke (Tobacco/Vaping) for 24 hours prior to your procedure.  If you use a CPAP at night, you may bring your mask/headgear for your overnight stay.   Contacts, glasses, piercing's, hearing aid's, dentures or partials may not be worn into surgery, please bring cases for these belongings.    For patients admitted to the hospital, discharge time will be determined by your treatment team.   Patients discharged the day of surgery will not be allowed to drive home, and someone needs to stay with them for 24 hours.  SURGICAL WAITING ROOM VISITATION Patients having surgery or a procedure may have no more than 2 support people in the waiting area - these visitors  may rotate.   Children under the age of 11 must have an adult with them who is not the patient. If the patient needs to stay at the hospital during part of their recovery, the visitor guidelines for inpatient rooms apply. Pre-op nurse will coordinate an appropriate time for 1 support person to accompany patient in pre-op.  This support person may not rotate.   Please refer to the [facility] website for the visitor guidelines for Inpatients (after your surgery is over and you are in a regular room).    Special instructions:   [facility]- Preparing For Surgery  Before surgery, you can play an important role. Because skin is not sterile, your skin needs to be as free of germs as possible. You can reduce the number of germs on your skin by washing with CHG (chlorahexidine gluconate) Soap before surgery.  CHG is an antiseptic cleaner which kills germs and bonds with the skin to continue killing germs even after washing.    Oral Hygiene is also important to reduce your risk of infection.  Remember - BRUSH YOUR TEETH THE MORNING OF SURGERY WITH YOUR REGULAR TOOTHPASTE  Please do not use if you have an allergy to CHG or antibacterial soaps. If your skin becomes reddened/irritated stop using the CHG.  Do not shave (including legs and underarms) for at least 48 hours prior to first CHG shower. It is OK to shave your face.  Please follow these instructions carefully.   Shower the NIGHT BEFORE SURGERY and the MORNING OF SURGERY  If you chose to wash your hair, wash your hair first as usual with your normal shampoo.  After you shampoo, rinse your hair and body thoroughly to remove the shampoo.  Use CHG Soap as you would any other liquid soap. You can apply CHG directly to the skin and wash gently with a scrungie or a clean washcloth.   Apply the CHG Soap to your body ONLY FROM THE NECK DOWN.  Do not use on open wounds or open sores. Avoid contact with your eyes, ears, mouth and genitals (private  parts). Wash Face and genitals (private parts)  with your normal soap.   Wash thoroughly, paying special attention to the area where your surgery will be performed.  Thoroughly rinse your body with warm water from the neck down.  DO NOT shower/wash with your normal soap after using and rinsing off the CHG Soap.  Pat yourself dry with a CLEAN TOWEL.  Wear CLEAN PAJAMAS to bed the night before surgery  Place CLEAN SHEETS on your bed the night before your surgery  DO NOT SLEEP WITH PETS.   Day of Surgery: Take a shower with CHG soap. Do not wear jewelry. Do not wear lotions, powders, cologne, or deodorant. Men may shave face and neck. Do not bring valuables to the hospital.  [facility] is not  responsible for any belongings or valuables. Wear Clean/Comfortable clothing the morning of surgery Remember to brush your teeth WITH YOUR REGULAR TOOTHPASTE.   Please read over the following fact sheets that you were given.    If you received a COVID test during your pre-op visit  it is requested that you wear a mask when out in public, stay away from anyone that may not be feeling well and notify your surgeon if you develop symptoms. If you have been in contact with anyone that has tested positive in the last 10 days please notify you surgeon.

## 2022-02-06 ENCOUNTER — Encounter (HOSPITAL_COMMUNITY): Payer: Self-pay

## 2022-02-06 ENCOUNTER — Encounter (HOSPITAL_COMMUNITY)
Admission: RE | Admit: 2022-02-06 | Discharge: 2022-02-06 | Disposition: A | Discharge status: Home / Self Care | Payer: Medicaid Other | Source: Ambulatory Visit | Attending: Orthopedic Surgery | Admitting: Orthopedic Surgery

## 2022-02-06 ENCOUNTER — Other Ambulatory Visit: Payer: Self-pay

## 2022-02-06 VITALS — BP 134/83 | HR 94 | Temp 98.1°F | Resp 18 | Ht 71.5 in | Wt 234.2 lb

## 2022-02-06 DIAGNOSIS — Z01818 Encounter for other preprocedural examination: Secondary | ICD-10-CM | POA: Insufficient documentation

## 2022-02-06 DIAGNOSIS — E119 Type 2 diabetes mellitus without complications: Secondary | ICD-10-CM | POA: Diagnosis not present

## 2022-02-06 DIAGNOSIS — Z794 Long term (current) use of insulin: Secondary | ICD-10-CM | POA: Diagnosis not present

## 2022-02-06 LAB — BASIC METABOLIC PANEL
Anion gap: 10 (ref 5–15)
BUN: 13 mg/dL (ref 8–23)
CO2: 22 mmol/L (ref 22–32)
Calcium: 9.4 mg/dL (ref 8.9–10.3)
Chloride: 109 mmol/L (ref 98–111)
Creatinine, Ser: 1.01 mg/dL (ref 0.61–1.24)
GFR, Estimated: >60 mL/min (ref >60)
Glucose, Bld: 206 mg/dL — ABNORMAL HIGH (ref 70–99)
Potassium: 3.8 mmol/L (ref 3.5–5.1)
Sodium: 141 mmol/L (ref 135–145)

## 2022-02-06 LAB — CBC
HCT: 40.2 % (ref 39.0–52.0)
Hemoglobin: 13.5 g/dL (ref 13.0–17.0)
MCH: 27.4 pg (ref 26.0–34.0)
MCHC: 33.6 g/dL (ref 30.0–36.0)
MCV: 81.5 fL (ref 80.0–100.0)
Platelets: 225 10*3/uL (ref 150–400)
RBC: 4.93 MIL/uL (ref 4.22–5.81)
RDW: 13.4 % (ref 11.5–15.5)
WBC: 6.1 10*3/uL (ref 4.0–10.5)
nRBC: 0 % (ref 0.0–0.2)

## 2022-02-06 LAB — GLUCOSE, CAPILLARY: Glucose-Capillary: 229 mg/dL — ABNORMAL HIGH (ref 70–99)

## 2022-02-06 LAB — HEMOGLOBIN A1C
Hgb A1c MFr Bld: 8.1 % — ABNORMAL HIGH (ref 4.8–5.6)
Mean Plasma Glucose: 185.77 mg/dL

## 2022-02-06 NOTE — Progress Notes (Signed)
PCP - Dr. Eliezer Mccoy and Pediatrics Medicine  Cardiologist - denies  PPM/ICD - n/a  Chest x-ray - n/a EKG - 02/06/22 Stress Test - denies ECHO - denies Cardiac Cath - denies  Sleep Study - denies CPAP - denies  Fasting Blood Sugar - 120-150 Checks Blood Sugar 1 time a day CBG at PAT: 229. Pt recently received a steroid injection from Dr. Tempie Donning which has caused increased CBGs. Pt monitors this at home. Will obtain A1C today.  Last dose of GLP1 agonist-  n/a GLP1 instructions: n/a  Blood Thinner Instructions: n/a Aspirin Instructions: n/a  ERAS Protcol -Clear liquids until 1000 DOS PRE-SURGERY Ensure or G2- G2 provided.  COVID TEST- n/a   Anesthesia review: No  Patient denies shortness of breath, fever, cough and chest pain at PAT appointment   All instructions explained to the patient, with a verbal understanding of the material. Patient agrees to go over the instructions while at home for a better understanding. Patient also instructed to self quarantine after being tested for COVID-19. The opportunity to ask questions was provided.

## 2022-02-07 NOTE — Progress Notes (Signed)
Anesthesia Chart Review:  Case: [id number] Date/Time: 02/09/22 1245   Procedure: WRIST PROXIMAL ROW CARPECTOMY (Left) - regional or general  120   Anesthesia type: Monitor Anesthesia Care   Pre-op diagnosis: Left wrist osteoarthritis   Location: [facility] OR ROOM 05 / [facility]   Surgeons: Sherilyn Cooter, MD       DISCUSSION: Patient is a 61 year old male scheduled for the above procedure.   History includes former smoker, DM2, HLD, nephrolithiasis. BMI is consistent with obesity.   By notes, he had to have a COVID-19 test to return to work after his wife had [place]. 02/07/20 test was negative (see Care Everywhere).  A1c on 02/06/22 was 8.1%. He reported home fasting CBGs of 120-150. He reported recent steroid injection by Dr. Robina Ade which he attributed to higher than usual CBGs. He is on Jardiance 10 mg A AM, Levemir 10 units BID, metformin 1000 mg BID.  Result communicated to Dr. Tempie Donning.  Anesthesia team to evaluate on the day of surgery. He will get a CBG on arrival.   VS: BP 134/83   Pulse 94   Temp 36.7 C   Resp 18   Ht 5' 11.5" (1.816 m)   Wt 106.2 kg   SpO2 97%   BMI 32.21 kg/m   PROVIDERS: PCP - Dr. Eliezer Mccoy and Pediatrics Medicine    LABS: Preoperative labs noted. A1c 8.1%, previously 7.3% on 12/28/20 and 6.9% on 07/04/21. See DISCUSSION. (all labs ordered are listed, but only abnormal results are displayed)  Labs Reviewed  GLUCOSE, CAPILLARY - Abnormal; Notable for the following components:      Result Value   Glucose-Capillary 229 (*)    All other components within normal limits  BASIC METABOLIC PANEL - Abnormal; Notable for the following components:   Glucose, Bld 206 (*)    All other components within normal limits  HEMOGLOBIN A1C - Abnormal; Notable for the following components:   Hgb A1c MFr Bld 8.1 (*)    All other components within normal limits  CBC    EKG: 02/06/22: Normal sinus rhythm Normal ECG No previous ECGs available Confirmed by  [place], [place] [phone]) on 02/06/2022 5:28:23 PM   CV: US Aorta 06/02/21: IMPRESSION: No evidence of aortic aneurysm.  No acute abnormality.   Past Medical History:  Diagnosis Date   Arthritis    Diabetes mellitus without complication ([place])    History of kidney stones    Hyperlipidemia     History reviewed. No pertinent surgical history.  MEDICATIONS:  atorvastatin (LIPITOR) 40 MG tablet   JARDIANCE 10 MG TABS tablet   LEVEMIR FLEXPEN 100 UNIT/ML FlexPen   losartan (COZAAR) 50 MG tablet   metFORMIN (GLUCOPHAGE) 1000 MG tablet   No current facility-administered medications for this encounter.    Myra Gianotti, PA-C Surgical Short Stay/Anesthesiology [facility] Phone [phone] [facility] Phone [phone] 02/07/2022 12:54 PM

## 2022-02-07 NOTE — Anesthesia Preprocedure Evaluation (Addendum)
Anesthesia Evaluation  Patient identified by MRN, date of birth, ID band Patient awake    Reviewed: Allergy & Precautions, NPO status , Patient's Chart, lab work & pertinent test results  Airway Mallampati: II  TM Distance: >3 FB Neck ROM: Full    Dental  (+) Dental Advisory Given, Missing   Pulmonary former smoker   Pulmonary exam normal breath sounds clear to auscultation       Cardiovascular negative cardio ROS  Rhythm:Regular Rate:Normal     Neuro/Psych negative neurological ROS     GI/Hepatic negative GI ROS, Neg liver ROS,,,  Endo/Other  diabetes    Renal/GU negative Renal ROS     Musculoskeletal  (+) Arthritis ,    Abdominal  (+) + obese  Peds  Hematology negative hematology ROS (+)   Anesthesia Other Findings   Reproductive/Obstetrics                             Anesthesia Physical Anesthesia Plan  ASA: 2  Anesthesia Plan:    Post-op Pain Management: Tylenol PO (pre-op)*, Celebrex PO (pre-op)* and Regional block*   Induction: Intravenous  PONV Risk Score and Plan: 1 and Ondansetron, Propofol infusion, TIVA, Treatment may vary due to age or medical condition and Midazolam  Airway Management Planned: Natural Airway  Additional Equipment:   Intra-op Plan:   Post-operative Plan:   Informed Consent: I have reviewed the patients History and Physical, chart, labs and discussed the procedure including the risks, benefits and alternatives for the proposed anesthesia with the patient or authorized representative who has indicated his/her understanding and acceptance.     Dental advisory given  Plan Discussed with: CRNA  Anesthesia Plan Comments: (PAT note written 02/07/2022 by Shonna Chock, PA-C. )        Anesthesia Quick Evaluation

## 2022-02-09 ENCOUNTER — Ambulatory Visit (HOSPITAL_BASED_OUTPATIENT_CLINIC_OR_DEPARTMENT_OTHER): Payer: Medicaid Other | Admitting: Anesthesiology

## 2022-02-09 ENCOUNTER — Other Ambulatory Visit: Payer: Self-pay

## 2022-02-09 ENCOUNTER — Encounter (HOSPITAL_COMMUNITY)
Admission: RE | Disposition: A | Discharge status: Home / Self Care | Payer: Self-pay | Source: Home / Self Care | Attending: Orthopedic Surgery

## 2022-02-09 ENCOUNTER — Ambulatory Visit (HOSPITAL_COMMUNITY): Payer: Medicaid Other

## 2022-02-09 ENCOUNTER — Encounter (HOSPITAL_COMMUNITY): Payer: Self-pay | Admitting: Orthopedic Surgery

## 2022-02-09 ENCOUNTER — Ambulatory Visit (HOSPITAL_COMMUNITY): Payer: Medicaid Other | Admitting: Vascular Surgery

## 2022-02-09 ENCOUNTER — Ambulatory Visit (HOSPITAL_COMMUNITY)
Admission: RE | Admit: 2022-02-09 | Discharge: 2022-02-09 | Disposition: A | Discharge status: Home / Self Care | Payer: Medicaid Other | Attending: Orthopedic Surgery | Admitting: Orthopedic Surgery

## 2022-02-09 DIAGNOSIS — M19032 Primary osteoarthritis, left wrist: Secondary | ICD-10-CM | POA: Diagnosis not present

## 2022-02-09 DIAGNOSIS — Z87891 Personal history of nicotine dependence: Secondary | ICD-10-CM | POA: Insufficient documentation

## 2022-02-09 DIAGNOSIS — Z7984 Long term (current) use of oral hypoglycemic drugs: Secondary | ICD-10-CM | POA: Insufficient documentation

## 2022-02-09 DIAGNOSIS — E119 Type 2 diabetes mellitus without complications: Secondary | ICD-10-CM | POA: Insufficient documentation

## 2022-02-09 HISTORY — PX: CARPECTOMY: SHX5004

## 2022-02-09 LAB — GLUCOSE, CAPILLARY
Glucose-Capillary: 155 mg/dL — ABNORMAL HIGH (ref 70–99)
Glucose-Capillary: 133 mg/dL — ABNORMAL HIGH (ref 70–99)
Glucose-Capillary: 150 mg/dL — ABNORMAL HIGH (ref 70–99)

## 2022-02-09 SURGERY — CARPECTOMY
Anesthesia: Monitor Anesthesia Care | Site: Wrist | Laterality: Left

## 2022-02-09 MED ORDER — LACTATED RINGERS IV SOLN: INTRAVENOUS | Status: DC

## 2022-02-09 MED ORDER — FENTANYL CITRATE (PF) 250 MCG/5ML IJ SOLN
INTRAMUSCULAR | Status: AC
  Filled 2022-02-09: qty 5

## 2022-02-09 MED ORDER — ACETAMINOPHEN 500 MG PO TABS
1000.0000 mg | ORAL_TABLET | Freq: Once | ORAL | Status: AC
  Administered 2022-02-09: 1000 mg via ORAL
  Filled 2022-02-09: qty 2

## 2022-02-09 MED ORDER — MEPERIDINE HCL 25 MG/ML IJ SOLN: 6.2500 mg | INTRAMUSCULAR | Status: DC | PRN

## 2022-02-09 MED ORDER — FENTANYL CITRATE (PF) 100 MCG/2ML IJ SOLN
INTRAMUSCULAR | Status: AC
  Administered 2022-02-09: 100 ug via INTRAVENOUS
  Filled 2022-02-09: qty 2

## 2022-02-09 MED ORDER — ORAL CARE MOUTH RINSE: 15.0000 mL | Freq: Once | OROMUCOSAL | Status: AC

## 2022-02-09 MED ORDER — CEFAZOLIN SODIUM-DEXTROSE 2-4 GM/100ML-% IV SOLN
2.0000 g | INTRAVENOUS | Status: AC
  Administered 2022-02-09: 2 g via INTRAVENOUS
  Filled 2022-02-09: qty 100

## 2022-02-09 MED ORDER — PHENYLEPHRINE 80 MCG/ML (10ML) SYRINGE FOR IV PUSH (FOR BLOOD PRESSURE SUPPORT)
PREFILLED_SYRINGE | INTRAVENOUS | Status: DC | PRN
  Administered 2022-02-09 (×8): 80 ug via INTRAVENOUS
  Administered 2022-02-09: 160 ug via INTRAVENOUS
  Administered 2022-02-09 (×7): 80 ug via INTRAVENOUS
  Administered 2022-02-09: 160 ug via INTRAVENOUS

## 2022-02-09 MED ORDER — CHLORHEXIDINE GLUCONATE 0.12 % MT SOLN
15.0000 mL | Freq: Once | OROMUCOSAL | Status: AC
  Administered 2022-02-09: 15 mL via OROMUCOSAL

## 2022-02-09 MED ORDER — PHENYLEPHRINE HCL (PRESSORS) 10 MG/ML IV SOLN
INTRAVENOUS | Status: AC
  Filled 2022-02-09: qty 1

## 2022-02-09 MED ORDER — CELECOXIB 200 MG PO CAPS
200.0000 mg | ORAL_CAPSULE | Freq: Once | ORAL | Status: AC
  Administered 2022-02-09: 200 mg via ORAL
  Filled 2022-02-09: qty 1

## 2022-02-09 MED ORDER — PROPOFOL 10 MG/ML IV BOLUS
INTRAVENOUS | Status: AC
  Filled 2022-02-09: qty 20

## 2022-02-09 MED ORDER — PROPOFOL 10 MG/ML IV BOLUS
INTRAVENOUS | Status: DC | PRN
  Administered 2022-02-09: 50 mg via INTRAVENOUS

## 2022-02-09 MED ORDER — PROPOFOL 1000 MG/100ML IV EMUL
INTRAVENOUS | Status: AC
  Filled 2022-02-09: qty 100

## 2022-02-09 MED ORDER — DEXAMETHASONE SODIUM PHOSPHATE 4 MG/ML IJ SOLN
INTRAMUSCULAR | Status: DC | PRN
  Administered 2022-02-09: 5 mg via PERINEURAL

## 2022-02-09 MED ORDER — INSULIN ASPART 100 UNIT/ML IJ SOLN: 0.0000 [IU] | INTRAMUSCULAR | Status: DC | PRN

## 2022-02-09 MED ORDER — MIDAZOLAM HCL 2 MG/2ML IJ SOLN
INTRAMUSCULAR | Status: AC
  Filled 2022-02-09: qty 2

## 2022-02-09 MED ORDER — CHLORHEXIDINE GLUCONATE 0.12 % MT SOLN
15.0000 mL | Freq: Once | OROMUCOSAL | Status: AC
  Filled 2022-02-09: qty 15

## 2022-02-09 MED ORDER — ROPIVACAINE HCL 7.5 MG/ML IJ SOLN
INTRAMUSCULAR | Status: DC | PRN
  Administered 2022-02-09: 20 mL via PERINEURAL

## 2022-02-09 MED ORDER — CLONIDINE HCL (ANALGESIA) 100 MCG/ML EP SOLN
EPIDURAL | Status: DC | PRN
  Administered 2022-02-09: 80 ug

## 2022-02-09 MED ORDER — PROMETHAZINE HCL 25 MG/ML IJ SOLN: 6.2500 mg | INTRAMUSCULAR | Status: DC | PRN

## 2022-02-09 MED ORDER — ROPIVACAINE HCL 5 MG/ML IJ SOLN
INTRAMUSCULAR | Status: DC | PRN
  Administered 2022-02-09: 15 mL via PERINEURAL

## 2022-02-09 MED ORDER — ONDANSETRON HCL 4 MG/2ML IJ SOLN
INTRAMUSCULAR | Status: DC | PRN
  Administered 2022-02-09: 4 mg via INTRAVENOUS

## 2022-02-09 MED ORDER — FENTANYL CITRATE (PF) 100 MCG/2ML IJ SOLN: 100.0000 ug | Freq: Once | INTRAMUSCULAR | Status: AC

## 2022-02-09 MED ORDER — 0.9 % SODIUM CHLORIDE (POUR BTL) OPTIME
TOPICAL | Status: DC | PRN
  Administered 2022-02-09: 1000 mL

## 2022-02-09 MED ORDER — FENTANYL CITRATE (PF) 100 MCG/2ML IJ SOLN: 25.0000 ug | INTRAMUSCULAR | Status: DC | PRN

## 2022-02-09 MED ORDER — PROPOFOL 500 MG/50ML IV EMUL
INTRAVENOUS | Status: DC | PRN
  Administered 2022-02-09: 40 ug/kg/min via INTRAVENOUS

## 2022-02-09 SURGICAL SUPPLY — 41 items
BAG COUNTER SPONGE SURGICOUNT (BAG) ×1 IMPLANT
BENZOIN TINCTURE PRP APPL 2/3 (GAUZE/BANDAGES/DRESSINGS) IMPLANT
BLADE CLIPPER SURG (BLADE) IMPLANT
BNDG ELASTIC 3X5.8 VLCR STR LF (GAUZE/BANDAGES/DRESSINGS) ×1 IMPLANT
BNDG ELASTIC 4X5.8 VLCR NS LF (GAUZE/BANDAGES/DRESSINGS) IMPLANT
BNDG ELASTIC 4X5.8 VLCR STR LF (GAUZE/BANDAGES/DRESSINGS) IMPLANT
BNDG GAUZE DERMACEA FLUFF 4 (GAUZE/BANDAGES/DRESSINGS) ×1 IMPLANT
COVER SURGICAL LIGHT HANDLE (MISCELLANEOUS) ×1 IMPLANT
CUFF TOURN SGL QUICK 18X4 (TOURNIQUET CUFF) IMPLANT
CUFF TOURN SGL QUICK 24 (TOURNIQUET CUFF)
CUFF TRNQT CYL 24X4X16.5-23 (TOURNIQUET CUFF) IMPLANT
DRSG EMULSION OIL 3X3 NADH (GAUZE/BANDAGES/DRESSINGS) IMPLANT
DRSG XEROFORM 1X8 (GAUZE/BANDAGES/DRESSINGS) IMPLANT
GAUZE SPONGE 4X4 12PLY STRL (GAUZE/BANDAGES/DRESSINGS) IMPLANT
GAUZE XEROFORM 1X8 LF (GAUZE/BANDAGES/DRESSINGS) IMPLANT
GLOVE BIOGEL PI IND STRL 8.5 (GLOVE) ×1 IMPLANT
GLOVE SURG ORTHO 8.0 STRL STRW (GLOVE) ×1 IMPLANT
GOWN STRL REUS W/ TWL LRG LVL3 (GOWN DISPOSABLE) ×2 IMPLANT
GOWN STRL REUS W/TWL LRG LVL3 (GOWN DISPOSABLE) ×2
KIT BASIN OR (CUSTOM PROCEDURE TRAY) ×1 IMPLANT
KIT TURNOVER KIT B (KITS) ×1 IMPLANT
MANIFOLD NEPTUNE II (INSTRUMENTS) ×1 IMPLANT
NS IRRIG 1000ML POUR BTL (IV SOLUTION) ×1 IMPLANT
PACK ORTHO EXTREMITY (CUSTOM PROCEDURE TRAY) ×1 IMPLANT
PAD ARMBOARD 7.5X6 YLW CONV (MISCELLANEOUS) ×2 IMPLANT
PADDING CAST ABS COTTON 3X4 (CAST SUPPLIES) IMPLANT
PADDING CAST ABS COTTON 4X4 ST (CAST SUPPLIES) IMPLANT
SLING ARM IMMOBILIZER LRG (SOFTGOODS) IMPLANT
SOAP 2 % CHG 4 OZ (WOUND CARE) ×1 IMPLANT
SPLINT FIBERGLASS 4X30 (CAST SUPPLIES) IMPLANT
STRIP CLOSURE SKIN 1/2X4 (GAUZE/BANDAGES/DRESSINGS) IMPLANT
SUT ETHIBOND 3 0 SH 1 (SUTURE) IMPLANT
SUT ETHILON 4 0 P 3 18 (SUTURE) IMPLANT
SUT ETHILON 5 0 P 3 18 (SUTURE)
SUT MERSILENE 4 0 P 3 (SUTURE) IMPLANT
SUT MNCRL AB 3-0 PS2 18 (SUTURE) IMPLANT
SUT NYLON ETHILON 5-0 P-3 1X18 (SUTURE) IMPLANT
SUT PROLENE 4 0 P 3 18 (SUTURE) IMPLANT
TOWEL GREEN STERILE (TOWEL DISPOSABLE) ×1 IMPLANT
TOWEL GREEN STERILE FF (TOWEL DISPOSABLE) ×1 IMPLANT
WATER STERILE IRR 1000ML POUR (IV SOLUTION) ×1 IMPLANT

## 2022-02-09 NOTE — Interval H&P Note (Signed)
History and Physical Interval Note:  02/09/2022 12:38 PM  Micheal Garcia  has presented today for surgery, with the diagnosis of Left wrist osteoarthritis.  The various methods of treatment have been discussed with the patient and family. After consideration of risks, benefits and other options for treatment, the patient has consented to  Procedure(s) with comments: LEFT WRIST PROXIMAL ROW CARPECTOMY, POSTERIOR INTEROSSEOUS NERVE NEURECTOMY (Left) - regional or general  120 as a surgical intervention.  The patient's history has been reviewed, patient examined, no change in status, stable for surgery.  I have reviewed the patient's chart and labs.  Questions were answered to the patient's satisfaction.     Tyanne Derocher Hiroki Wint

## 2022-02-09 NOTE — Discharge Instructions (Signed)
Audria Nine, M.D. Hand Surgery  POST-OPERATIVE DISCHARGE INSTRUCTIONS   PRESCRIPTIONS: You may have been given a prescription to be taken as directed for post-operative pain control.  You may also take over the counter ibuprofen/aleve and tylenol for pain. Take this as directed on the packaging. Do not exceed 3000 mg tylenol/acetaminophen in 24 hours.  Ibuprofen 600-800 mg (3-4) tablets by mouth every 6 hours as needed for pain.  OR Aleve 2 tablets by mouth every 12 hours (twice daily) as needed for pain.  AND/OR Tylenol 1000 mg (2 tablets) every 8 hours as needed for pain.  Please use your pain medication carefully, as refills are limited and you may not be provided with one.  As stated above, please use over the counter pain medicine - it will also be helpful with decreasing your swelling.    ANESTHESIA: After your surgery, post-surgical discomfort or pain is likely. This discomfort can last several days to a few weeks. At certain times of the day your discomfort may be more intense.   Did you receive a nerve block?  A nerve block can provide pain relief for one hour to two days after your surgery. As long as the nerve block is working, you will experience little or no sensation in the area the surgeon operated on.  As the nerve block wears off, you will begin to experience pain or discomfort. It is very important that you begin taking your prescribed pain medication before the nerve block fully wears off. Treating your pain at the first sign of the block wearing off will ensure your pain is better controlled and more tolerable when full-sensation returns. Do not wait until the pain is intolerable, as the medicine will be less effective. It is better to treat pain in advance than to try and catch up.   General Anesthesia:  If you did not receive a nerve block during your surgery, you will need to start taking your pain medication shortly after your surgery and should continue  to do so as prescribed by your surgeon.     ICE AND ELEVATION: You may use ice for the first 48-72 hours, but it is not critical.   Motion of your fingers is very important to decrease the swelling.  Elevation, as much as possible for the next 48 hours, is critical for decreasing swelling as well as for pain relief. Elevation means when you are seated or lying down, you hand should be at or above your heart. When walking, the hand needs to be at or above the level of your elbow.  If the bandage gets too tight, it may need to be loosened. Please contact our office and we will instruct you in how to do this.    SURGICAL BANDAGES:  Keep your dressing and/or splint clean and dry at all times.  Do not remove until you are seen again in the office.  If careful, you may place a plastic bag over your bandage and tape the end to shower, but be careful, do not get your bandages wet.     HAND THERAPY:  You will not start therapy until after the cast comes off which will be in approximately one month from today.    ACTIVITY AND WORK: You are encouraged to move any fingers which are not in the bandage.  Light use of the fingers is allowed to assist the other hand with daily hygiene and eating, but strong gripping or lifting is often uncomfortable and  should be avoided.  You might miss a variable period of time from work and hopefully this issue has been discussed prior to surgery. You may not do any heavy work with your affected hand for about 2 weeks.    [facility] Second Floor, [address],  [place] [address] [phone]

## 2022-02-09 NOTE — Op Note (Signed)
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  The midcarpal joint appears to be unaffected on x-rays.  He has failed conservative management including immobilization, occupational therapy, oral anti-inflammatory medication, and corticosteroid injections.  Risks, benefits, and alternatives to surgery were again discussed with the patient in the preoperative area. The patient wishes to proceed with surgery.  Informed consent was signed after our discussion.   PREOPERATIVE DIAGNOSIS:  Left wrist arthritis (SLAC)  POSTOPERATIVE DIAGNOSIS: Same.  PROCEDURE:  Left proximal row carpectomy (39767) Left posterior interosseous neurectomy (34193)   SURGEON: Audria Nine, M.D.  ASSIST:   ANESTHESIA:  Regional, MAC  IV FLUIDS AND URINE: See anesthesia.  ESTIMATED BLOOD LOSS: 10 mL.  IMPLANTS: * No implants in log *   DRAINS: None  COMPLICATIONS: None  DESCRIPTION OF PROCEDURE: The patient was met in the preoperative holding area where the surgical site was marked and the consent form was signed.  The patient was then taken to the operating room and transferred to the operating table.  All bony prominences were well padded.  A tourniquet was applied to the left upper arm.  Monitored anesthesia was induced.  The operative extremity was prepped and draped in the usual and sterile fashion.  A formal time-out was performed to confirm that this was the correct patient, surgery, side, and site.   Following formal timeout, the limb was gently exsanguinated with an Esmarch bandage and the tourniquet inflated 250 mmHg.  I began by making a longitudinal incision just ulnar to Lister's tubercle.  Skin was incised.  Small crossing vessels were coagulated with bipolar cautery.  Full-thickness skin flaps were elevated taking care to protect the subcutaneous nerve branches.  The EPL  tendon was identified and the third dorsal compartment was incised.  The tendon was retracted radially.  The interval between the fourth dorsal compartment and the underlying wrist capsule was developed using a 15 blade scalpel.  Care was taken to avoid injuring the volar aspect of the fourth dorsal compartment to allow for smooth gliding of the tendons over the repaired capsule.  This tissue was elevated as ulnar as possible.  The tendons of the second dorsal compartment were retracted radially.  The posterior osseous nerve was identified.  A segment of the nerve was resected and the ends were clamped to avoid symptomatic neuroma formation.  A distally based U-shaped capsular flap was developed.  This was elevated as radial and ulnar as possible to obtain a clear view of the proximal carpal row.  The scaphoid was identified and the proximal pole showed significant eburnation.  The scapholunate and lunotriquetral ligaments were divided.  The scaphoid was divided at its midportion in line with the fibers of the radioscaphocapitate ligament.  The proximal pole was removed using a combination of 15 blade scalpel and rongeur.  The distal pole was removed piecemeal.  There was significant eburnation of the scaphoid facet of the radius.  The soft tissue attachments of the lunate were similarly divided using a 15 blade scalpel.  The lunate was removed in several fragments using a rongeur.  The radioscaphocapitate ligament was found to be intact.  The head of the capitate had some mild wear but overall the cartilage was maintained.  There was minimal cartilage loss on the lunate facet of the radius.  I did not feel that a capsular interposition or resurfacing procedure was warranted.  The  triquetrum was removed using a McGlamry elevator and a rongeur.  The wrist was elevated under fluoroscopy and the capitate was found to be stable within the lunate facet.  There was no visible or palpable impingement of the triquetrum on  the radial styloid with maximal radial deviation.  The joint was then thoroughly irrigated with copious sterile saline.  The capsular flap was closed using a 3-0 Ethibond suture in a figure-of-eight fashion.  The EPL tendon was transposed and the extensor retinaculum closed using a 4-0 Mersilene suture.  The tourniquet was let down.  Hemostasis was achieved with direct pressure of the wound.  The subcutaneous tissue was thoroughly irrigated with copious sterile saline.  The skin was then closed using a 3-0 Monocryl in a buried interrupted fashion followed by 4-0 nylon suture in horizontal mattress fashion.  The wound was then dressed with Xeroform, folded Kerlix, cast padding, and a well-padded sugar-tong splint was applied.  The patient was reversed from anesthesia.  They were transferred from the operating table to the postoperative bed.  All counts were correct x 2 at the end of the procedure.  The patient was then taken to the PACU in stable condition.   POSTOPERATIVE PLAN: He will be discharged home with appropriate pain medication and discharge instructions.  I will see him back in my office in 10 to 14 days.  We will get a 2 view x-ray of the left wrist out of the splint.  We will transition him to a short arm cast for an additional 2 weeks after which point we will start therapy.  Audria Nine, MD 3:41 PM

## 2022-02-09 NOTE — H&P (Signed)
HAND SURGERY    HPI: Micheal Garcia is a 61 y.o. male who presents with left SLAC arthritis.  He has been dealing with this for quite some time and has failed conservative management with hand therapy, oral NSAIDs, immobilization, and multiple corticosteroid injections. He presents today for left PRC and PIN neurectomy.  He denies any systemic symptoms today and denies any changes to his medical history.   Hand dominance: Right Occupation: Geophysicist/field seismologist  Past Medical History:  Diagnosis Date   Arthritis    Diabetes mellitus without complication ([place])    History of kidney stones    Hyperlipidemia    History reviewed. No pertinent surgical history. Social History   Socioeconomic History   Marital status: Married    Spouse name: Not on file   Number of children: Not on file   Years of education: Not on file   Highest education level: Not on file  Occupational History   Not on file  Tobacco Use   Smoking status: Former    Types: Cigarettes   Smokeless tobacco: Never   Tobacco comments:    Quit cigarettes over 20 years ago.  Vaping Use   Vaping Use: Never used  Substance and Sexual Activity   Alcohol use: Not Currently   Drug use: Not Currently    Types: Marijuana    Comment: in the past   Sexual activity: Not on file  Other Topics Concern   Not on file  Social History Narrative   Not on file   Social Determinants of Health   Financial Resource Strain: Not on file  Food Insecurity: Not on file  Transportation Needs: Not on file  Physical Activity: Not on file  Stress: Not on file  Social Connections: Not on file   History reviewed. No pertinent family history. - negative except otherwise stated in the family history section No Known Allergies Prior to Admission medications   Medication Sig Start Date End Date Taking? Authorizing Provider  atorvastatin (LIPITOR) 40 MG tablet Take 40 mg by mouth daily. 04/14/21  Yes [provider]  JARDIANCE 10 MG TABS  tablet Take 10 mg by mouth every morning.   Yes [provider]  LEVEMIR FLEXPEN 100 UNIT/ML FlexPen Inject 10 Units into the skin 2 (two) times daily.   Yes [provider]  losartan (COZAAR) 50 MG tablet Take 50 mg by mouth daily. 03/21/21  Yes [provider]  metFORMIN (GLUCOPHAGE) 1000 MG tablet Take 1,000 mg by mouth 2 (two) times daily with a meal. 03/25/21  Yes [provider]   No results found. - Positive ROS: All other systems have been reviewed and were otherwise negative with the exception of those mentioned in the HPI and as above.  Physical Exam: General: No acute distress, resting comfortably Cardiovascular: BUE warm and well perfused, normal rate Respiratory: Normal WOB on RA Skin: Warm and dry Neurologic: Sensation intact distally Psychiatric: Patient is at baseline mood and affect  Left Upper Extremity  Mild to moderate dorsal radial wrist swelling TTP along radiocarpal joint Wrist ROM limited by pain Able to make full fist and fully extend fingers SILT throughout hand Hand warm and well perfuse w/ BCR    Assessment: 61 yo RHD M w/ L SLAC wrist presents today for left PRC and PIN neurectomy.   Plan: - We again reviewed the risks of surgery which include bleeding, infection, damage to neurovascular structures, persistent pain, decreased ROM, need for additional surgery - Informed consent  was signed - Pt to be discharged to home from [place], M.D. [facility] 12:35 PM

## 2022-02-09 NOTE — Anesthesia Procedure Notes (Signed)
Anesthesia Regional Block: Axillary brachial plexus block   Pre-Anesthetic Checklist: , timeout performed,  Correct Patient, Correct Site, Correct Laterality,  Correct Procedure, Correct Position, site marked,  Risks and benefits discussed,  Surgical consent,  Pre-op evaluation,  At surgeon's request and post-op pain management  Laterality: Upper  Prep: chloraprep       Needles:  Injection technique: Single-shot  Needle Type: Stimiplex          Additional Needles:   Procedures:,,,, ultrasound used (permanent image in chart),,    Narrative:  Start time: 02/09/2022 12:25 PM End time: 02/09/2022 12:45 PM Injection made incrementally with aspirations every 5 mL.  Performed by: Personally  Anesthesiologist: [name], MD  Additional Notes: BP cuff, SpO2 and EKG monitors applied. Sedation begun. Nerve location verified with ultrasound. Anesthetic injected incrementally, slowly, and after neg aspirations under direct u/s guidance. Good perineural spread. Tolerated well.

## 2022-02-09 NOTE — Anesthesia Procedure Notes (Signed)
Date/Time: 02/09/2022 1:10 PM  Performed by: Michele Rockers, CRNAPre-anesthesia Checklist: Patient identified, Emergency Drugs available, Suction available, Timeout performed and Patient being monitored Patient Re-evaluated:Patient Re-evaluated prior to induction Oxygen Delivery Method: Simple face mask

## 2022-02-09 NOTE — Transfer of Care (Signed)
Immediate Anesthesia Transfer of Care Note  Patient: Micheal Garcia  Procedure(s) Performed: LEFT WRIST PROXIMAL ROW CARPECTOMY, POSTERIOR INTEROSSEOUS NERVE NEURECTOMY (Left: Wrist)  Patient Location: PACU  Anesthesia Type:MAC combined with regional for post-op pain  Level of Consciousness: awake, alert , and oriented  Airway & Oxygen Therapy: Patient Spontanous Breathing  Post-op Assessment: Report given to RN and Post -op Vital signs reviewed and stable  Post vital signs: Reviewed and stable  Last Vitals:  Vitals Value Taken Time  BP 106/74 02/09/22 1539  Temp    Pulse 71 02/09/22 1542  Resp 14 02/09/22 1542  SpO2 92 % 02/09/22 1542  Vitals shown include unvalidated device data.  Last Pain:  Vitals:   02/09/22 1134  TempSrc:   PainSc: 3          Complications: No notable events documented.

## 2022-02-10 ENCOUNTER — Encounter (HOSPITAL_COMMUNITY): Payer: Self-pay | Admitting: Orthopedic Surgery

## 2022-02-10 NOTE — Anesthesia Postprocedure Evaluation (Signed)
Anesthesia Post Note  Patient: Micheal Garcia  Procedure(s) Performed: LEFT WRIST PROXIMAL ROW CARPECTOMY, POSTERIOR INTEROSSEOUS NERVE NEURECTOMY (Left: Wrist)     Patient location during evaluation: PACU Anesthesia Type: General Level of consciousness: sedated and patient cooperative Pain management: pain level controlled Vital Signs Assessment: post-procedure vital signs reviewed and stable Respiratory status: spontaneous breathing Cardiovascular status: stable Anesthetic complications: no   No notable events documented.  Last Vitals:  Vitals:   02/09/22 1600 02/09/22 1615  BP: 110/79 121/85  Pulse: 71 72  Resp: 12 17  Temp:  36.6 C  SpO2: 94% 93%    Last Pain:  Vitals:   02/09/22 1134  TempSrc:   PainSc: 3    Pain Goal:                   [name]

## 2023-01-26 IMAGING — CT CT RENAL STONE PROTOCOL
2 of 4 series · 16 of 46 positions shown, 18 images · non-contrast
Comparison: None.

CLINICAL DATA: Flank pain.  Right abdominal pain.



[Series 2: axial st · axial · 0.98mm/px · z∈[+887,+1342]mm · 13 of 103 slices shown, 15 images]
[im 6/103  soft-tissue]
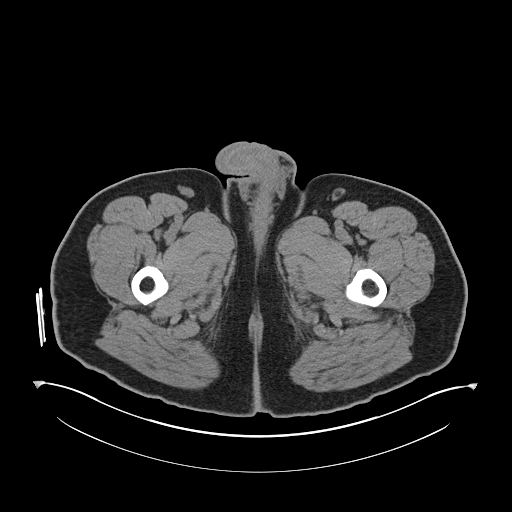
[im 6/103  bone]
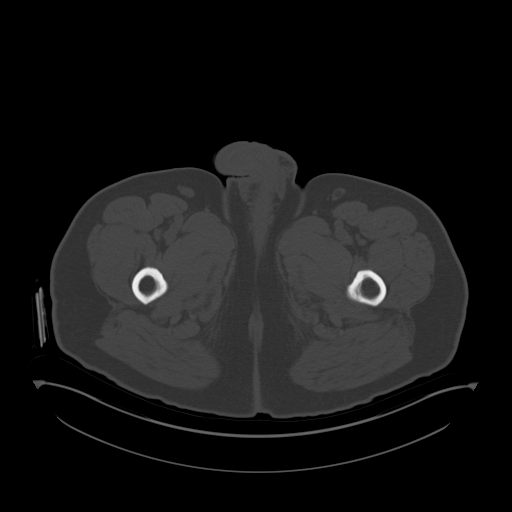
[im 17/103  soft-tissue]
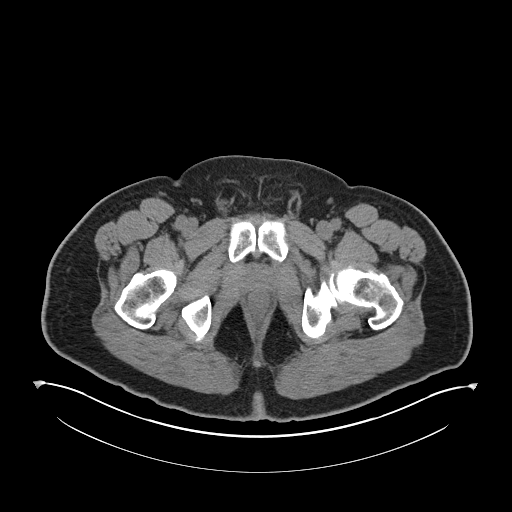
[im 22/103  soft-tissue]
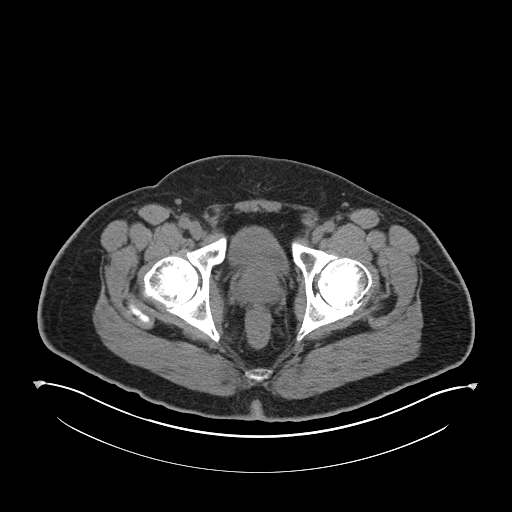
[im 27/103  soft-tissue]
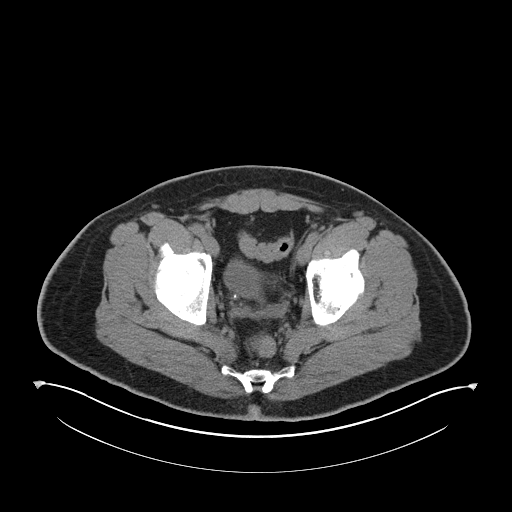
[im 38/103  soft-tissue]
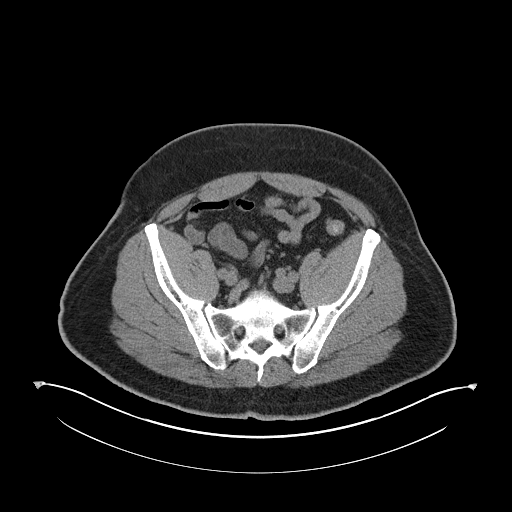
[im 43/103  soft-tissue]
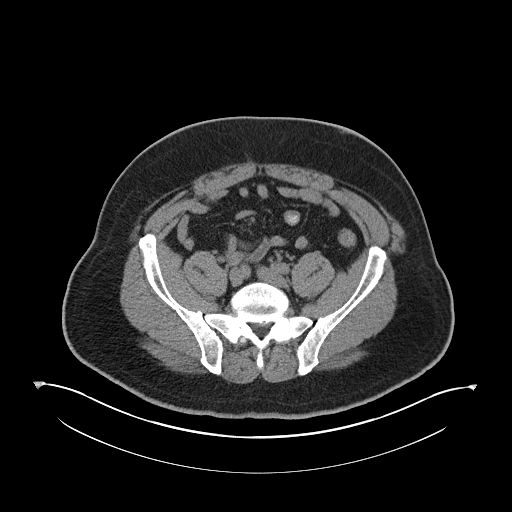
[im 54/103  soft-tissue]
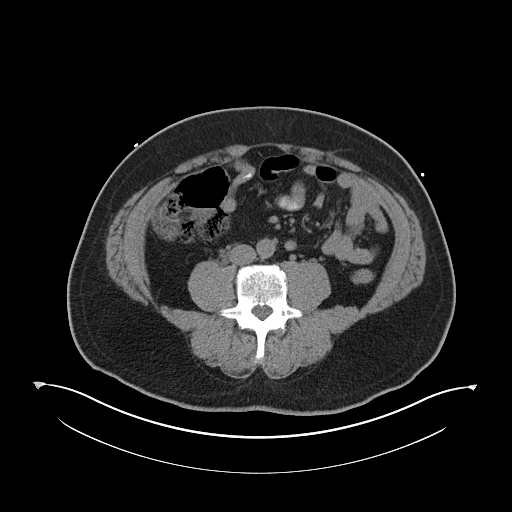
[im 60/103  soft-tissue]
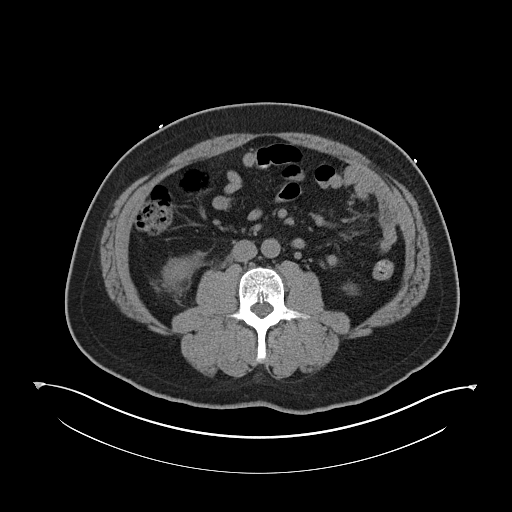
[im 65/103  soft-tissue]
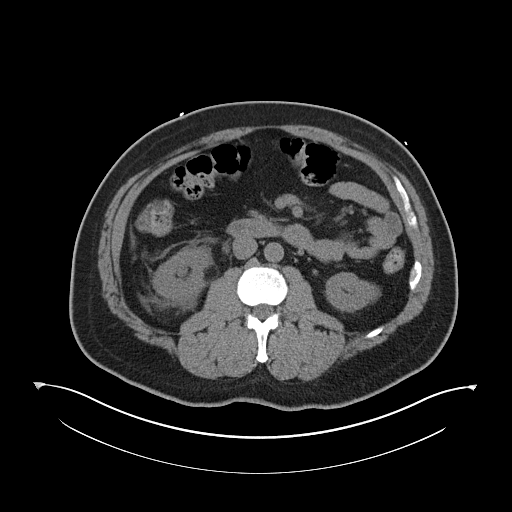
[im 65/103  bone]
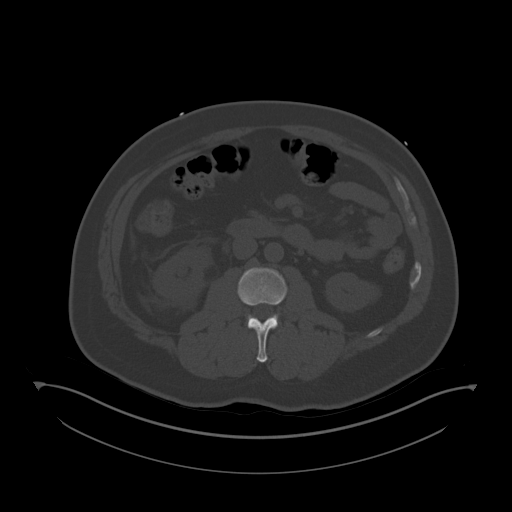
[im 76/103  soft-tissue]
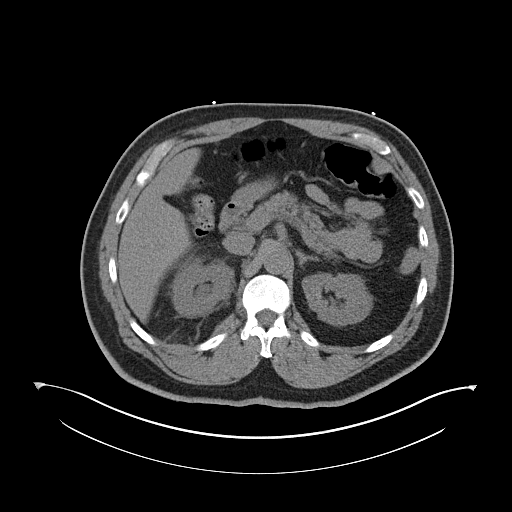
[im 81/103  soft-tissue]
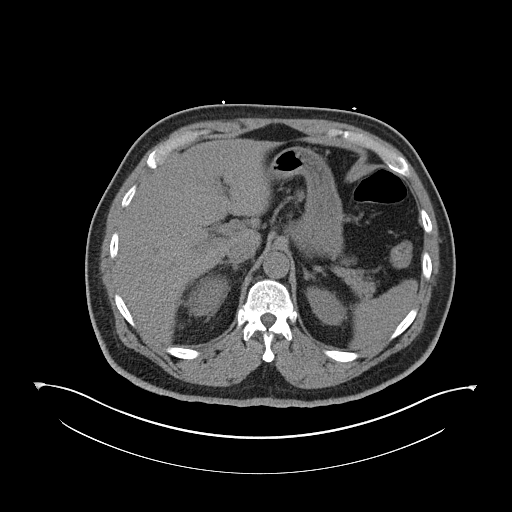
[im 86/103  soft-tissue]
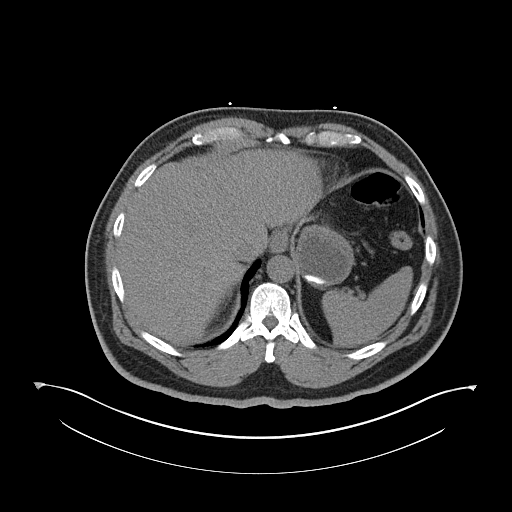
[im 97/103  soft-tissue]
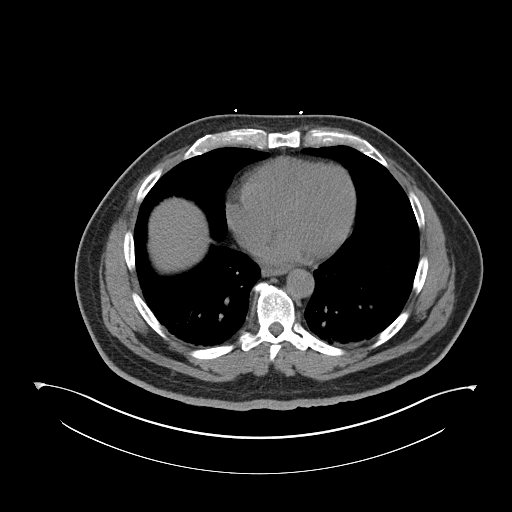

[Series 5: coronal · coronal · 0.91mm/px · 3 of 177 slices shown]
[im 59/177  soft-tissue]
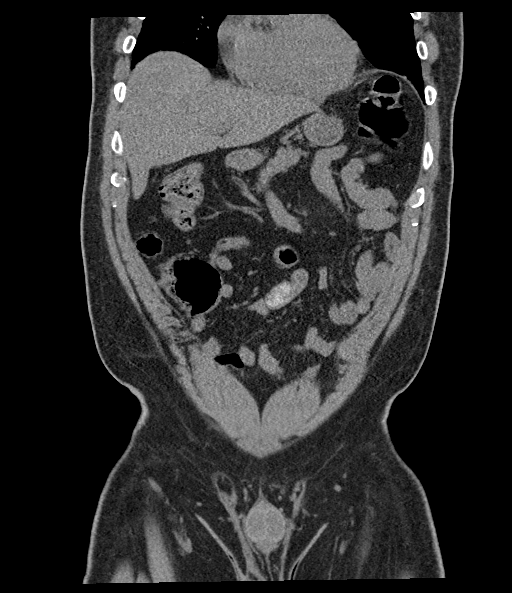
[im 79/177  soft-tissue]
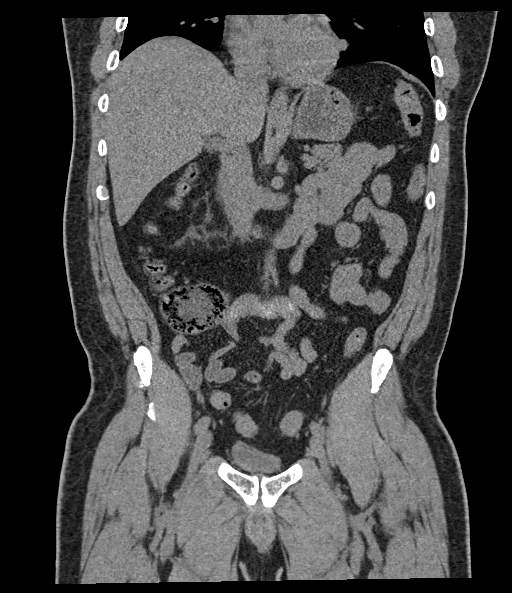
[im 98/177  soft-tissue]
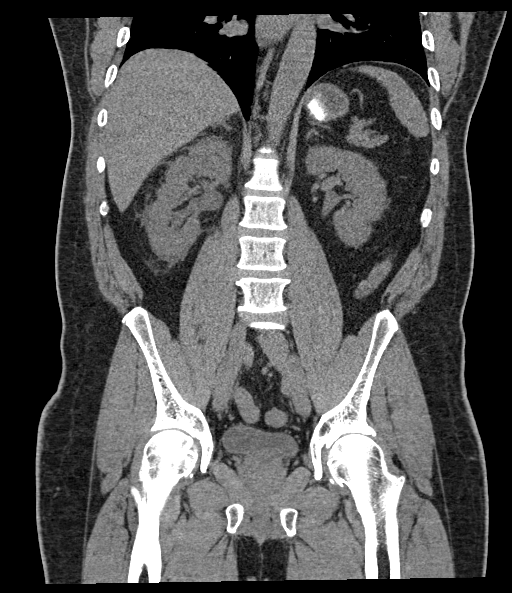

[16 of 46 positions shown; findings below may reference images not displayed]

FINDINGS: Lower chest: Bibasilar atelectasis.

Hepatobiliary: No focal liver abnormality is seen. Prior
cholecystectomy.

Pancreas: Unremarkable. No pancreatic ductal dilatation or
surrounding inflammatory changes.

Spleen: Normal in size without focal abnormality.

Adrenals/Urinary Tract: Adrenal glands are unremarkable. 2 mm distal
right ureteral calculus just proximal to the UVJ resulting in mild
right hydroureteronephrosis and right perinephric stranding. No
other urolithiasis. No left obstructive uropathy. Normal bladder.

Stomach/Bowel: Stomach is within normal limits. No evidence of bowel
wall thickening, distention, or inflammatory changes.

Vascular/Lymphatic: No significant vascular findings are present. No
enlarged abdominal or pelvic lymph nodes.

Reproductive: Prostate is unremarkable.

Other: No abdominal wall hernia or abnormality. No abdominopelvic
ascites.

Musculoskeletal: No acute osseous abnormality. No aggressive osseous
lesion. Severe bilateral facet arthropathy at L4-5.
IMPRESSION: 1. A 2 mm distal right ureteral calculus just proximal to the UVJ
resulting in mild right hydroureteronephrosis and right perinephric
stranding.
